# Patient Record
Sex: Female | Born: 1980 | Race: White | Hispanic: No | Marital: Married | Smoking: Never smoker
Health system: Southern US, Community
[De-identification: ages and names within clinical notes are randomized; demographics above are authoritative.]

## PROBLEM LIST (undated history)

## (undated) DIAGNOSIS — R011 Cardiac murmur, unspecified: Secondary | ICD-10-CM

## (undated) DIAGNOSIS — R5383 Other fatigue: Secondary | ICD-10-CM

## (undated) DIAGNOSIS — F419 Anxiety disorder, unspecified: Secondary | ICD-10-CM

## (undated) DIAGNOSIS — C801 Malignant (primary) neoplasm, unspecified: Secondary | ICD-10-CM

## (undated) DIAGNOSIS — R51 Headache: Secondary | ICD-10-CM

## (undated) DIAGNOSIS — R519 Headache, unspecified: Secondary | ICD-10-CM

## (undated) DIAGNOSIS — G901 Familial dysautonomia [Riley-Day]: Secondary | ICD-10-CM

## (undated) HISTORY — DX: Anxiety disorder, unspecified: F41.9

## (undated) HISTORY — DX: Familial dysautonomia (riley-day): G90.1

## (undated) HISTORY — DX: Headache, unspecified: R51.9

## (undated) HISTORY — DX: Malignant (primary) neoplasm, unspecified: C80.1

## (undated) HISTORY — DX: Headache: R51

## (undated) HISTORY — DX: Other fatigue: R53.83

## (undated) HISTORY — DX: Cardiac murmur, unspecified: R01.1

## (undated) HISTORY — PX: WISDOM TOOTH EXTRACTION: SHX21

---

## 1999-07-21 DIAGNOSIS — C801 Malignant (primary) neoplasm, unspecified: Secondary | ICD-10-CM

## 1999-07-21 HISTORY — DX: Malignant (primary) neoplasm, unspecified: C80.1

## 2013-08-21 ENCOUNTER — Ambulatory Visit: Payer: Self-pay | Admitting: Family Medicine

## 2013-09-06 ENCOUNTER — Ambulatory Visit: Payer: Self-pay | Admitting: Family Medicine

## 2013-10-23 ENCOUNTER — Ambulatory Visit: Payer: Self-pay | Admitting: Family Medicine

## 2013-10-26 ENCOUNTER — Encounter: Payer: Self-pay | Admitting: Family Medicine

## 2013-10-26 ENCOUNTER — Ambulatory Visit (INDEPENDENT_AMBULATORY_CARE_PROVIDER_SITE_OTHER): Payer: BC Managed Care – PPO | Admitting: Family Medicine

## 2013-10-26 VITALS — BP 112/64 | HR 71 | Temp 98.1°F | Ht 64.5 in | Wt 114.5 lb

## 2013-10-26 DIAGNOSIS — G901 Familial dysautonomia [Riley-Day]: Secondary | ICD-10-CM | POA: Insufficient documentation

## 2013-10-26 DIAGNOSIS — Z8669 Personal history of other diseases of the nervous system and sense organs: Secondary | ICD-10-CM | POA: Insufficient documentation

## 2013-10-26 DIAGNOSIS — G909 Disorder of the autonomic nervous system, unspecified: Secondary | ICD-10-CM

## 2013-10-26 DIAGNOSIS — F418 Other specified anxiety disorders: Secondary | ICD-10-CM | POA: Insufficient documentation

## 2013-10-26 DIAGNOSIS — G43909 Migraine, unspecified, not intractable, without status migrainosus: Secondary | ICD-10-CM | POA: Insufficient documentation

## 2013-10-26 NOTE — Progress Notes (Signed)
   Subjective:   Patient ID: Holly Wolf, female    DOB: 11-09-1980, 33 y.o.   MRN: [id number]  Holly Wolf is a pleasant 32 y.o. year old female who presents to clinic today with [place]  on 01/20/1637  HPI: Very complicated medical history.  Diagnosed with dysautonomia several years ago in grad school.  Multiple syncopal episodes and variations in her BP.  Per pt, most antihypertensives worsen her dysautonomia.  She does take atenolol for migraine prophylaxis. Migraines much improved since she left her job at [employer] and grad students.  Now a Production assistant, radio and job much less stressful.  Migraines- was followed by Dr. Governor Specking at [facility] for migraine headaches.  Could not tolerate many migraine prophylactic treatments.  Takes imitrex, toradol, fiorcet, flexeril and cambia for abortive therapy and atenolol and lexapro for prophylaxis.  She denies any anxiety or depression but takes seroquel as well.  She reports this is all for her migraines and dysautonomia however old records indicate "history of psychiatric hospitalization."  Also takes benzos when she feels her dysautonomia makes it impossible for her to relax.   Pap smear UTD until 2016.  Still seeing Dr. Greig Right at [facility]- she brings in those records with her today.  Patient Active Problem List   Diagnosis Date Noted  . History of migraine headaches 10/26/2013  . Dysautonomia 10/26/2013   Past Medical History  Diagnosis Date  . 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 Frequent headaches   . Heart murmur   . Dysautonomia    Past Surgical History  Procedure Laterality Date  . Wisdom tooth extraction  1999-2000   History  Substance Use Topics  . Smoking status: Never Smoker   . Smokeless tobacco: Never Used  . Alcohol Use: Yes   Family History  Problem Relation Age of Onset  . Hypertension Mother   . COPD Mother   . Cancer Mother   . Stroke Mother   . Hyperlipidemia Father   . Hypertension Sister     . Hyperlipidemia Sister   . Arthritis Maternal Grandmother   . Heart disease Maternal Grandmother   . Heart disease Maternal Grandfather    No Known Allergies No current outpatient prescriptions on file prior to visit.   No current facility-administered medications on file prior to visit.   The PMH, PSH, Social History, Family History, Medications, and allergies have been reviewed in [facility], and have been updated if relevant.     Review of Systems    See HPI  Objective:    BP 112/64  Pulse 71  Temp(Src) 98.1 F (36.7 C) (Oral)  Ht 5' 4.5" (1.638 m)  Wt 114 lb 8 oz (51.937 kg)  BMI 19.36 kg/m2  SpO2 99%   Physical Exam Gen:  Alert, pleasant, NAD Psych:  Good eye contact, conversant and pleasant       Assessment & Plan:   History of migraine headaches  Depression with anxiety  Dysautonomia Return in about 2 weeks (around 11/09/2013).

## 2013-10-26 NOTE — Assessment & Plan Note (Signed)
>  25 minutes spent in face to face time with patient, >50% spent in counselling or coordination of care Pt not examined during this office visit and we discussed her extensive medical history and reviewed her old records.  She will make an appt for a CPX in 1-2 weeks.

## 2013-10-26 NOTE — Progress Notes (Signed)
Pre visit review using our clinic review tool, if applicable. No additional management support is needed unless otherwise documented below in the visit note. 

## 2013-10-26 NOTE — Patient Instructions (Signed)
Nice to meet you. Please make an appointment at 7:15 am (for complete physical)- in 1-2 weeks.

## 2013-11-14 ENCOUNTER — Encounter: Payer: Self-pay | Admitting: *Deleted

## 2013-11-14 ENCOUNTER — Encounter: Payer: Self-pay | Admitting: Family Medicine

## 2013-11-14 ENCOUNTER — Ambulatory Visit (INDEPENDENT_AMBULATORY_CARE_PROVIDER_SITE_OTHER): Payer: BC Managed Care – PPO | Admitting: Family Medicine

## 2013-11-14 VITALS — BP 116/74 | HR 75 | Temp 97.6°F | Ht 65.0 in | Wt 115.2 lb

## 2013-11-14 DIAGNOSIS — I951 Orthostatic hypotension: Principal | ICD-10-CM

## 2013-11-14 DIAGNOSIS — G90A Postural orthostatic tachycardia syndrome (POTS): Secondary | ICD-10-CM | POA: Insufficient documentation

## 2013-11-14 DIAGNOSIS — I498 Other specified cardiac arrhythmias: Secondary | ICD-10-CM

## 2013-11-14 DIAGNOSIS — Z0000 Encounter for general adult medical examination without abnormal findings: Secondary | ICD-10-CM

## 2013-11-14 DIAGNOSIS — Z136 Encounter for screening for cardiovascular disorders: Secondary | ICD-10-CM

## 2013-11-14 DIAGNOSIS — Z8669 Personal history of other diseases of the nervous system and sense organs: Secondary | ICD-10-CM

## 2013-11-14 DIAGNOSIS — B0229 Other postherpetic nervous system involvement: Secondary | ICD-10-CM | POA: Insufficient documentation

## 2013-11-14 DIAGNOSIS — G909 Disorder of the autonomic nervous system, unspecified: Secondary | ICD-10-CM

## 2013-11-14 DIAGNOSIS — R000 Tachycardia, unspecified: Principal | ICD-10-CM

## 2013-11-14 DIAGNOSIS — G901 Familial dysautonomia [Riley-Day]: Secondary | ICD-10-CM

## 2013-11-14 LAB — CBC WITH DIFFERENTIAL/PLATELET
BASOS ABS: 0 10*3/uL (ref 0.0–0.1)
Basophils Relative: 0.6 % (ref 0.0–3.0)
Eosinophils Absolute: 0.2 10*3/uL (ref 0.0–0.7)
Eosinophils Relative: 3.2 % (ref 0.0–5.0)
HCT: 40.6 % (ref 36.0–46.0)
HEMOGLOBIN: 13.7 g/dL (ref 12.0–15.0)
LYMPHS PCT: 27 % (ref 12.0–46.0)
Lymphs Abs: 1.3 10*3/uL (ref 0.7–4.0)
MCHC: 33.8 g/dL (ref 30.0–36.0)
MCV: 89.9 fl (ref 78.0–100.0)
MONOS PCT: 9 % (ref 3.0–12.0)
Monocytes Absolute: 0.4 10*3/uL (ref 0.1–1.0)
NEUTROS PCT: 60.2 % (ref 43.0–77.0)
Neutro Abs: 3 10*3/uL (ref 1.4–7.7)
Platelets: 225 10*3/uL (ref 150.0–400.0)
RBC: 4.51 Mil/uL (ref 3.87–5.11)
RDW: 13.4 % (ref 11.5–14.6)
WBC: 5 10*3/uL (ref 4.5–10.5)

## 2013-11-14 LAB — COMPREHENSIVE METABOLIC PANEL
ALBUMIN: 4.2 g/dL (ref 3.5–5.2)
ALT: 11 U/L (ref 0–35)
AST: 13 U/L (ref 0–37)
Alkaline Phosphatase: 28 U/L — ABNORMAL LOW (ref 39–117)
BUN: 9 mg/dL (ref 6–23)
CALCIUM: 8.9 mg/dL (ref 8.4–10.5)
CHLORIDE: 105 meq/L (ref 96–112)
CO2: 25 mEq/L (ref 19–32)
Creatinine, Ser: 0.6 mg/dL (ref 0.4–1.2)
GFR: 112.89 mL/min (ref >60.00)
Glucose, Bld: 88 mg/dL (ref 70–99)
POTASSIUM: 4.2 meq/L (ref 3.5–5.1)
Sodium: 137 mEq/L (ref 135–145)
Total Bilirubin: 0.3 mg/dL (ref 0.3–1.2)
Total Protein: 6.9 g/dL (ref 6.0–8.3)

## 2013-11-14 LAB — LIPID PANEL
CHOL/HDL RATIO: 2
Cholesterol: 126 mg/dL (ref 0–200)
HDL: 58.4 mg/dL (ref >39.00)
LDL Cholesterol: 57 mg/dL (ref 0–99)
Triglycerides: 55 mg/dL (ref 0.0–149.0)
VLDL: 11 mg/dL (ref 0.0–40.0)

## 2013-11-14 LAB — TSH: TSH: 0.78 u[IU]/mL (ref 0.35–5.50)

## 2013-11-14 NOTE — Assessment & Plan Note (Signed)
Reviewed preventive care protocols, scheduled due services, and updated immunizations Discussed nutrition, exercise, diet, and healthy lifestyle. Orders Placed This Encounter  Procedures  . CBC with Differential  . Comprehensive metabolic panel  . Lipid panel  . TSH  . Ambulatory referral to Neurology    Referral Priority:  Routine    Referral Type:  Consultation    Referral Reason:  Specialty Services Required    Requested Specialty:  Neurology    Number of Visits Requested:  1  . Ambulatory referral to Cardiology    Referral Priority:  Routine    Referral Type:  Consultation    Referral Reason:  Specialty Services Required    Requested Specialty:  Cardiology    Number of Visits Requested:  1

## 2013-11-14 NOTE — Assessment & Plan Note (Signed)
Deteriorated but feels she is managing them ok.  Does not want to try another medication given side effects.

## 2013-11-14 NOTE — Progress Notes (Signed)
Pre visit review using our clinic review tool, if applicable. No additional management support is needed unless otherwise documented below in the visit note. 

## 2013-11-14 NOTE — Progress Notes (Signed)
Subjective:   Patient ID: Holly Wolf, female    DOB: 02-Jul-1981, 33 y.o.   MRN: [id number]  Holly Wolf is a pleasant 33 y.o. year old female who presents to clinic today for CPX/ Follow-up and Headache  on 1/76/1607  HPI: Very complicated medical history.  Established care with me two weeks ago and I asked to give me a couple of weeks to get her old records and review them.    Diagnosed with dysautonomia several years ago in grad school.  Multiple syncopal episodes and variations in her BP.  Per pt, most antihypertensives worsen her dysautonomia.  She does take atenolol for migraine prophylaxis. Last syncopal episode two weeks ago but this past week, she had to lay down a lot to avoid syncope.  Migraines much improved since she left her job at [employer] and grad students.  Now a Production assistant, radio and job much less stressful.  Has seen Dr.Klein at [facility] who has since retired (dysautonomia specialist)  Migraines- was followed by Dr. Governor Specking at [facility] for migraine headaches.  Could not tolerate many migraine prophylactic treatments.  Takes imitrex, toradol, fiorcet, flexeril and cambia for abortive therapy and atenolol and lexapro for prophylaxis.  She denies any anxiety or depression but takes seroquel as well. Also takes benzos when she feels her dysautonomia makes it impossible for her to relax.  Has a headache today.  Pap smear UTD until 09/2014.  Still seeing Dr. Greig Right at [facility]- she brings in those records with her today.  Has a dermatologist as well. Patient Active Problem List   Diagnosis Date Noted  . Routine general medical examination at a health care facility 11/14/2013  . Post herpetic neuralgia 11/14/2013  . POTS (postural orthostatic tachycardia syndrome) 11/14/2013  . History of migraine headaches 10/26/2013  . Dysautonomia 10/26/2013   Past Medical History  Diagnosis Date  . 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 Frequent headaches   . Heart murmur   .  Dysautonomia    Past Surgical History  Procedure Laterality Date  . Wisdom tooth extraction  1999-2000   History  Substance Use Topics  . Smoking status: Never Smoker   . Smokeless tobacco: Never Used  . Alcohol Use: Yes   Family History  Problem Relation Age of Onset  . Hypertension Mother   . COPD Mother   . Cancer Mother   . Stroke Mother   . Hyperlipidemia Father   . Hypertension Sister   . Hyperlipidemia Sister   . Arthritis Maternal Grandmother   . Heart disease Maternal Grandmother   . Heart disease Maternal Grandfather    No Known Allergies Current Outpatient Prescriptions on File Prior to Visit  Medication Sig Dispense Refill  . atenolol (TENORMIN) 25 MG tablet Take by mouth daily. Take 1/2 tablet every morning      . butalbital-acetaminophen-caffeine (FIORICET, ESGIC) 50-325-40 MG per tablet Take by mouth every 4 (four) hours as needed for headache. Take 1-2 capsules every 4 hours as needed      . clonazePAM (KLONOPIN) 1 MG tablet Take 1 mg by mouth every 6 (six) hours as needed for anxiety.      . cyclobenzaprine (FLEXERIL) 10 MG tablet Take 10 mg by mouth 3 (three) times daily as needed for muscle spasms. Take 1/2-1 tab daily as needed      . Diclofenac Potassium (CAMBIA) 50 MG PACK Take 50 mg by mouth as needed. Use as directed. Take no more than two  days per week      . escitalopram (LEXAPRO) 10 MG tablet Take 10 mg by mouth daily. Take 1/2 tab every morning      . ketorolac (TORADOL) 10 MG tablet Take 10 mg by mouth every 6 (six) hours as needed.      Marland Kitchen LORazepam (ATIVAN) 0.5 MG tablet Take 0.5 mg by mouth every 8 (eight) hours. Take 1-3 tabs three times daily as needed      . ondansetron (ZOFRAN) 4 MG tablet Take 4 mg by mouth every 8 (eight) hours as needed for nausea or vomiting.      . promethazine (PHENERGAN) 25 MG tablet Take 25 mg by mouth every 6 (six) hours as needed for nausea or vomiting.      Marland Kitchen QUEtiapine (SEROQUEL) 25 MG tablet Take 25 mg by mouth at  bedtime as needed.      . SUMAtriptan (IMITREX) 100 MG tablet Take 100 mg by mouth every 2 (two) hours as needed for migraine or headache. May repeat in 2 hours if headache persists or recurs.      . SUMAtriptan Succinate (SUMAVEL DOSEPRO) 6 MG/0.5ML SOTJ Inject 6 mg into the skin. Use as directed. Use no more than two days per week       No current facility-administered medications on file prior to visit.   The PMH, PSH, Social History, Family History, Medications, and allergies have been reviewed in [facility], and have been updated if relevant.     Review of Systems    See HPI + presyncope with vasalva Last syncopal episode two weeks ago Denies anxiety or depression No changes in bowel habits Objective:    BP 116/74  Pulse 75  Temp(Src) 97.6 F (36.4 C) (Oral)  Ht 5\' 5"  (1.651 m)  Wt 115 lb 4 oz (52.277 kg)  BMI 19.18 kg/m2  SpO2 97%   Physical Exam  General:  Well-developed,well-nourished,in no acute distress; alert,appropriate and cooperative throughout examination Head:  normocephalic and atraumatic.   Eyes:  vision grossly intact, pupils equal, pupils round, and pupils reactive to light.   Ears:  R ear normal and L ear normal.   Nose:  no external deformity.   Mouth:  good dentition.   Neck:  No deformities, masses, or tenderness noted. Lungs:  Normal respiratory effort, chest expands symmetrically. Lungs are clear to auscultation, no crackles or wheezes. Heart:  Normal rate and regular rhythm. S1 and S2 normal without gallop, murmur, click, rub or other extra sounds. Abdomen:  Bowel sounds positive,abdomen soft and non-tender without masses, organomegaly or hernias noted. Msk:  No deformity or scoliosis noted of thoracic or lumbar spine.   Extremities:  No clubbing, cyanosis, edema, or deformity noted with normal full range of motion of all joints.   Neurologic:  alert & oriented X3 and gait normal.   Skin:  Intact without suspicious lesions or rashes +tattoos Cervical  Nodes:  No lymphadenopathy noted Axillary Nodes:  No palpable lymphadenopathy Psych:  Cognition and judgment appear intact. Alert and cooperative with normal attention span and concentration. No apparent delusions, illusions, hallucinations        Assessment & Plan:   POTS (postural orthostatic tachycardia syndrome) - Plan: Ambulatory referral to Cardiology  Routine general medical examination at a health care facility - Plan: CBC with Differential, Comprehensive metabolic panel, TSH  Dysautonomia - Plan: Ambulatory referral to Neurology, Ambulatory referral to Cardiology  History of migraine headaches  Post herpetic neuralgia  Screening for ischemic heart disease -  Plan: Lipid panel No Follow-up on file.

## 2013-11-14 NOTE — Patient Instructions (Signed)
I will call you with your lab results. We will call you with your referrals.  It was great to see you!

## 2013-11-14 NOTE — Assessment & Plan Note (Signed)
With POTS. See below.

## 2013-11-14 NOTE — Assessment & Plan Note (Signed)
Will refer to [facility] dysautonomia specialist, Dr. Marlynn Perking and Dr. Caryl Comes (cards) to see if we could treat her with saline infusions. The patient indicates understanding of these issues and agrees with the plan.

## 2013-11-17 ENCOUNTER — Other Ambulatory Visit: Payer: Self-pay | Admitting: Family Medicine

## 2013-11-17 DIAGNOSIS — R000 Tachycardia, unspecified: Principal | ICD-10-CM

## 2013-11-17 DIAGNOSIS — G90A Postural orthostatic tachycardia syndrome (POTS): Secondary | ICD-10-CM

## 2013-11-17 DIAGNOSIS — I951 Orthostatic hypotension: Principal | ICD-10-CM

## 2013-11-28 ENCOUNTER — Ambulatory Visit: Payer: BC Managed Care – PPO | Admitting: Internal Medicine

## 2014-01-18 ENCOUNTER — Ambulatory Visit: Payer: BC Managed Care – PPO | Admitting: Family Medicine

## 2014-01-18 ENCOUNTER — Encounter: Payer: Self-pay | Admitting: Family Medicine

## 2014-01-18 ENCOUNTER — Ambulatory Visit (INDEPENDENT_AMBULATORY_CARE_PROVIDER_SITE_OTHER): Payer: BC Managed Care – PPO | Admitting: Family Medicine

## 2014-01-18 VITALS — BP 112/76 | HR 73 | Temp 98.6°F | Wt 114.2 lb

## 2014-01-18 DIAGNOSIS — B0229 Other postherpetic nervous system involvement: Secondary | ICD-10-CM

## 2014-01-18 MED ORDER — CYCLOBENZAPRINE HCL 10 MG PO TABS: 10.0000 mg | ORAL_TABLET | Freq: Three times a day (TID) | ORAL | Status: DC | PRN

## 2014-01-18 MED ORDER — CLONAZEPAM 1 MG PO TABS: 1.0000 mg | ORAL_TABLET | Freq: Four times a day (QID) | ORAL | Status: DC | PRN

## 2014-01-18 MED ORDER — LORAZEPAM 0.5 MG PO TABS: 0.5000 mg | ORAL_TABLET | Freq: Three times a day (TID) | ORAL | Status: DC

## 2014-01-18 NOTE — Patient Instructions (Signed)
Let me talk to Dr. Deborra Medina about valproate on the meantime.  Rosaria Ferries will call about your referral. Try the lidocaine patches in the meantime.  Take care.

## 2014-01-18 NOTE — Progress Notes (Signed)
Pre visit review using our clinic review tool, if applicable. No additional management support is needed unless otherwise documented below in the visit note.  H/o shingles with postherpetic neuralgia. More pain recently.  Clearly dermatomal on the L side of the abd, stops at the midline, no new rash.  Pain daily since 2012 in the area.  Can't sleep on L side.  Now painful even w/o anything touching the area.  She never had a nerve block  Lidocaine patch didn't help, last tried a few months ago.  Options complicated by migraines (opiates likely not a good option) and her h/o dysautonomia (TCA likely not tolerable, gabapentin and lyrica didn't help prev).  D/w pt about options.    Meds, vitals, and allergies reviewed.   ROS: See HPI.  Otherwise, noncontributory.  nad ncat Mmm rrr ctab Abd soft, not ttp except in dermatomal area on the L side of the abdomen, change in sensation to light touch No rash Ext w/o edema

## 2014-01-21 NOTE — Assessment & Plan Note (Signed)
Would avoid gabapentin, lyrica, opiates, and TCAs.  Current meds noted. The only thing I could find o/w with some evidence would be valproate.  Her LFTs were prev wnl.  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  I would like to talk to her PCP first about use of valproate.  It appears that she is going to need a nerve block or similar in the long run and I referred her to a pain clinic.  She agrees. She can retry the lidocaine patches in the meantime, though they weren't very effective prev.  >25 minutes spent in face to face time with patient, >50% spent in counselling or coordination of care.

## 2014-01-22 ENCOUNTER — Telehealth: Payer: Self-pay | Admitting: Family Medicine

## 2014-01-22 ENCOUNTER — Telehealth: Payer: Self-pay | Admitting: *Deleted

## 2014-01-22 NOTE — Telephone Encounter (Signed)
Spoke with Dr. Damita Dunnings- see office note from 7/2.  Left voicemail for pt to return to my call concerning tx options. ? Valproate.

## 2014-01-22 NOTE — Telephone Encounter (Signed)
Pt returned your call--i did advise pt you are in 1/2 day tomorrow so to keep a look out for your call

## 2014-01-22 NOTE — Telephone Encounter (Signed)
Received a copy of the script back from pharmacy requesting verification of directions on Flexeril. Please advise if it should be one three times a day as needed or .5 - 1 by mouth daily as needed. Please advise.

## 2014-01-23 MED ORDER — CYCLOBENZAPRINE HCL 10 MG PO TABS: ORAL_TABLET | ORAL | Status: DC

## 2014-01-23 MED ORDER — DIVALPROEX SODIUM ER 500 MG PO TB24: ORAL_TABLET | ORAL | Status: DC

## 2014-01-23 NOTE — Telephone Encounter (Signed)
Lori at pharmacy notified as instructed by telephone.

## 2014-01-23 NOTE — Addendum Note (Signed)
Addended by: Lucille Passy on: 01/23/2014 08:41 AM   Modules accepted: Orders

## 2014-01-23 NOTE — Telephone Encounter (Signed)
Spoke with pt.  She would like to try depakote.  Rx sent. She will follow up with me in 1 month and we will recheck liver function at that time.

## 2014-01-23 NOTE — Telephone Encounter (Signed)
Thanks.  Corrected sig: Take up to 1 tab tid prn. Please notify pharmacy.  Remainder of rx is correct.

## 2014-01-30 ENCOUNTER — Ambulatory Visit: Payer: BC Managed Care – PPO | Admitting: Internal Medicine

## 2014-02-08 ENCOUNTER — Encounter: Payer: Self-pay | Admitting: Internal Medicine

## 2014-02-08 ENCOUNTER — Ambulatory Visit (INDEPENDENT_AMBULATORY_CARE_PROVIDER_SITE_OTHER): Payer: BC Managed Care – PPO | Admitting: Internal Medicine

## 2014-02-08 VITALS — BP 134/104 | HR 75 | Ht 64.5 in | Wt 110.0 lb

## 2014-02-08 DIAGNOSIS — R000 Tachycardia, unspecified: Principal | ICD-10-CM

## 2014-02-08 DIAGNOSIS — I951 Orthostatic hypotension: Principal | ICD-10-CM

## 2014-02-08 DIAGNOSIS — G90A Postural orthostatic tachycardia syndrome (POTS): Secondary | ICD-10-CM

## 2014-02-08 DIAGNOSIS — I498 Other specified cardiac arrhythmias: Secondary | ICD-10-CM

## 2014-02-08 NOTE — Progress Notes (Signed)
ELECTROPHYSIOLOGY CONSULT NOTE  Patient ID: Holly Wolf, MRN: [id number], DOB/AGE: 1981-06-15 33 y.o. Admit date: (Not on file) Date of Consult: 02/08/2014  Primary Physician: Arnette Norris, MD Primary Cardiologist: enw  Chief Complaint:  ?POTS   HPI Holly Wolf is a 33 y.o. 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  She is also suffered from migraine headaches and the medications for this have made her " fogginess" of  Mind worse.  She finds some days it is difficult to think clearly. She has multiple episodes of lightheadedness which are largely abated by becoming recumbent. There is associated fatigue both elevated by the spells as well as generalized.  While she was doing her postop, elevated blood pressures were noted. She has not so much noted this since beginning teaching. Blood pressures have been always ben  modestly on the higher side.  Echo presumably normal   she also had a series of neurological complaints including numbness on the outer aspect of her arm. She notes that she has a sensation even when lying down of being on a waterbed at constant motion.  There is fair amount of heat intolerance     Past Medical History  Diagnosis Date  . 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 Frequent headaches   . Heart murmur   . Dysautonomia   . Anxiety   . Fatigue       Surgical History:  Past Surgical History  Procedure Laterality Date  . Wisdom tooth extraction  1999-2000     Home Meds: Prior to Admission medications   Medication Sig Start Date End Date Taking? Authorizing Provider  atenolol (TENORMIN) 25 MG tablet Take by  mouth daily. Take 1/2 tablet every morning   Yes Historical Provider, MD  butalbital-acetaminophen-caffeine (FIORICET, ESGIC) 50-325-40 MG per tablet Take by mouth every 4 (four) hours as needed for headache. Take 1-2 capsules every 4 hours as needed   Yes Historical Provider, MD  clonazePAM (KLONOPIN) 1 MG tablet Take 1 tablet (1 mg total) by mouth every 6 (six) hours as needed for anxiety. 01/18/14  Yes Tonia Ghent, MD  cyclobenzaprine (FLEXERIL) 10 MG tablet Take up to 1 tab tid prn. 01/23/14  Yes Tonia Ghent, MD  Diclofenac Potassium (CAMBIA) 50 MG PACK Take 50 mg by mouth as needed. Use as directed. Take no more than two days per week   Yes Historical Provider, MD  divalproex (DEPAKOTE ER) 500 MG 24 hr tablet 1 tab my mouth daily, may increase to 2 tabs by mouth daily after 7 days if well tolerated 01/23/14  Yes Lucille Passy, MD  escitalopram (LEXAPRO) 10 MG tablet Take 10 mg by mouth daily. Take 1/2 tab every morning   Yes Historical Provider, MD  ketorolac (TORADOL) 10 MG tablet Take 10 mg by mouth as needed (up to twice a month).    Yes Historical Provider, MD  LORazepam (ATIVAN) 0.5 MG tablet Take 1 tablet (0.5 mg total) by mouth every 8 (eight) hours. Take 1-3 tabs three times daily as needed 01/18/14  Yes Tonia Ghent, MD  ondansetron (ZOFRAN) 4 MG tablet Take 4 mg by  mouth every 8 (eight) hours as needed for nausea or vomiting.   Yes Historical Provider, MD  promethazine (PHENERGAN) 25 MG tablet Take 25 mg by mouth every 6 (six) hours as needed for nausea or vomiting.   Yes Historical Provider, MD  QUEtiapine (SEROQUEL) 25 MG tablet Take 25 mg by mouth at bedtime as needed.   Yes Historical Provider, MD  SUMAtriptan (IMITREX) 100 MG tablet Take 100 mg by mouth every 2 (two) hours as needed for migraine or headache. May repeat in 2 hours if headache persists or recurs.   Yes Historical Provider, MD  SUMAtriptan Succinate (SUMAVEL DOSEPRO) 6 MG/0.5ML SOTJ Inject 6 mg into the skin. Use as  directed. Use no more than two days per week   Yes Historical Provider, MD    Allergies: No Known Allergies  History   Social History  . Marital Status: Married    Spouse Name: N/A    Number of Children: N/A  . Years of Education: N/A   Occupational History  . Not on file.   Social History Main Topics  . Smoking status: Never Smoker   . Smokeless tobacco: Never Used  . Alcohol Use: Yes  . Drug Use: No  . Sexual Activity: Yes   Other Topics Concern  . Not on file   Social History Narrative  . No narrative on file     Family History  Problem Relation Age of Onset  . Hypertension Mother   . COPD Mother   . Cancer Mother   . Stroke Mother   . Hyperlipidemia Father   . Hypertension Sister   . Hyperlipidemia Sister   . Arthritis Maternal Grandmother   . Heart disease Maternal Grandmother   . Heart disease Maternal Grandfather      ROS:  Please see the history of present illness.     All other systems reviewed and negative.    Physical Exam:   Blood pressure 134/104, pulse 75, height 5' 4.5" (1.638 m), weight 110 lb (49.896 kg). Alert and oriented in no acute distress HENT- normal Eyes- EOMI, without scleral icterus Skin- warm and dry; without rashes LN-neg Neck- supple without thyromegaly, JVP-flat, carotids brisk and full without bruits Back-without CVAT or kyphosis Lungs-clear to auscultation CV-Regular rate and rhythm, nl S1 and S2, no murmurs gallops or rubs, S4-absent Abd-soft with active bowel sounds; no midline pulsation or hepatomegaly Pulses-intact femoral and distal MKS-without gross deformity Neuro- Ax O, CN3-12 intact, grossly normal motor and sensory function Affect engaging     Labs: Cardiac Enzymes No results found for this basename: CKTOTAL, CKMB, TROPONINI,  in the last 72 hours CBC Lab Results  Component Value Date   WBC 5.0 11/14/2013   HGB 13.7 11/14/2013   HCT 40.6 11/14/2013   MCV 89.9 11/14/2013   PLT 225.0 11/14/2013    PROTIME: No results found for this basename: LABPROT, INR,  in the last 72 hours Chemistry No results found for this basename: NA, K, CL, CO2, BUN, CREATININE, CALCIUM, LABALBU, PROT, BILITOT, ALKPHOS, ALT, AST, GLUCOSE,  in the last 168 hours Lipids Lab Results  Component Value Date   CHOL 126 11/14/2013   HDL 58.40 11/14/2013   LDLCALC 57 11/14/2013   TRIG 55.0 11/14/2013   BNP No results found for this basename: probnp   Miscellaneous No results found for this basename: DDIMER    Radiology/Studies:  No results found.  EKG: NSR 80 15/07/38 67   Assessment and Plan:  Dizziness  Systemic fatigue  Hypertension  The patient carries a diagnosis of POTS and orthostatic intolerance. Unfortunately, her objective data today are not consistent with those diagnoses not withstanding the presence of significant symptoms.   She is scheduled to see Dr. Gordy Savers and I will. I think that we'll be very valuable with her expertise. I've also suggested that there may be some value in having objective autonomic testing done which she may be able to do or if not perhaps Dr. Debria Garret can do at [facility].   Of incontinence in the past regarding neurological findings along her arms; she also has recovered symptoms which are inconsistent with vasomotor instability. I have told her that I am not sure how to explain her symptoms given the objective measurements noted today.   She also asked about intravenous fluids and I told her that the recent guidelines have been is class III recommendations and so we would not be able to help her with this   Her elevated blood pressure raises other questions regarding mechanisms, ie systemic hyperadrenergic release   She has some vasomotor instability as well manifested by blanching and violacsous discoloration of her hands and feets

## 2014-02-08 NOTE — Patient Instructions (Signed)
Keep your follow up with Fenton Malling at [facility].  Referral to see Dr. Debria Garret at [facility], after seeing [place].  Follow up with Dr. Caryl Comes as needed.

## 2014-04-13 ENCOUNTER — Encounter: Payer: Self-pay | Admitting: Family Medicine

## 2014-04-13 ENCOUNTER — Ambulatory Visit (INDEPENDENT_AMBULATORY_CARE_PROVIDER_SITE_OTHER): Payer: BC Managed Care – PPO | Admitting: Family Medicine

## 2014-04-13 VITALS — BP 92/68 | HR 63 | Temp 98.2°F | Wt 116.5 lb

## 2014-04-13 DIAGNOSIS — Z309 Encounter for contraceptive management, unspecified: Secondary | ICD-10-CM

## 2014-04-13 DIAGNOSIS — G90A Postural orthostatic tachycardia syndrome (POTS): Secondary | ICD-10-CM

## 2014-04-13 DIAGNOSIS — B0229 Other postherpetic nervous system involvement: Secondary | ICD-10-CM

## 2014-04-13 DIAGNOSIS — R000 Tachycardia, unspecified: Secondary | ICD-10-CM

## 2014-04-13 DIAGNOSIS — I951 Orthostatic hypotension: Secondary | ICD-10-CM

## 2014-04-13 DIAGNOSIS — G901 Familial dysautonomia [Riley-Day]: Secondary | ICD-10-CM

## 2014-04-13 DIAGNOSIS — I498 Other specified cardiac arrhythmias: Secondary | ICD-10-CM

## 2014-04-13 DIAGNOSIS — G909 Disorder of the autonomic nervous system, unspecified: Secondary | ICD-10-CM

## 2014-04-13 LAB — CBC WITH DIFFERENTIAL/PLATELET
BASOS PCT: 0 % (ref 0–1)
Basophils Absolute: 0 10*3/uL (ref 0.0–0.1)
EOS ABS: 0 10*3/uL (ref 0.0–0.7)
EOS PCT: 1 % (ref 0–5)
HCT: 37.5 % (ref 36.0–46.0)
Hemoglobin: 13.2 g/dL (ref 12.0–15.0)
LYMPHS ABS: 1.7 10*3/uL (ref 0.7–4.0)
Lymphocytes Relative: 36 % (ref 12–46)
MCH: 29.9 pg (ref 26.0–34.0)
MCHC: 35.2 g/dL (ref 30.0–36.0)
MCV: 85 fL (ref 78.0–100.0)
Monocytes Absolute: 0.4 10*3/uL (ref 0.1–1.0)
Monocytes Relative: 9 % (ref 3–12)
Neutro Abs: 2.6 10*3/uL (ref 1.7–7.7)
Neutrophils Relative %: 54 % (ref 43–77)
PLATELETS: 208 10*3/uL (ref 150–400)
RBC: 4.41 MIL/uL (ref 3.87–5.11)
RDW: 14.4 % (ref 11.5–15.5)
WBC: 4.8 10*3/uL (ref 4.0–10.5)

## 2014-04-13 LAB — COMPREHENSIVE METABOLIC PANEL
ALK PHOS: 30 U/L — AB (ref 39–117)
ALT: 13 U/L (ref 0–35)
AST: 17 U/L (ref 0–37)
Albumin: 4.6 g/dL (ref 3.5–5.2)
BILIRUBIN TOTAL: 0.6 mg/dL (ref 0.2–1.2)
BUN: 7 mg/dL (ref 6–23)
CO2: 26 mEq/L (ref 19–32)
CREATININE: 0.84 mg/dL (ref 0.50–1.10)
Calcium: 9.5 mg/dL (ref 8.4–10.5)
Chloride: 101 mEq/L (ref 96–112)
GLUCOSE: 90 mg/dL (ref 70–99)
Potassium: 3.8 mEq/L (ref 3.5–5.3)
Sodium: 138 mEq/L (ref 135–145)
Total Protein: 7.1 g/dL (ref 6.0–8.3)

## 2014-04-13 MED ORDER — DIVALPROEX SODIUM ER 500 MG PO TB24: ORAL_TABLET | ORAL | Status: DC

## 2014-04-13 NOTE — Progress Notes (Signed)
Subjective:   Patient ID: Holly Wolf, female    DOB: Jul 05, 1981, 33 y.o.   MRN: [id number]  Holly Wolf is a pleasant 33 y.o. year old female who presents to clinic today for CPX/ Follow-up  on 5/95/6387  HPI: Very complicated medical history.  Here for follow up.  Post herpetic neuralgia- now followed by Dr. Vira Blanco at pain clinic. He tried to prescribe her a topical therapy with 10% ketamine.  She has not picked it up yet since insurance is giving her a tough time.  She is taking the Depakote which she feels is helping.    Needs referral to GYN since we discussed option of ablation to help with her hypotension.  Still needing infusions for her POTS. Patient Active Problem List   Diagnosis Date Noted  . Routine general medical examination at a health care facility 11/14/2013  . Post herpetic neuralgia 11/14/2013  . POTS (postural orthostatic tachycardia syndrome) 11/14/2013  . History of migraine headaches 10/26/2013  . Dysautonomia 10/26/2013   Past Medical History  Diagnosis Date  . 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 Frequent headaches   . Heart murmur   . Dysautonomia   . Anxiety   . Fatigue    Past Surgical History  Procedure Laterality Date  . Wisdom tooth extraction  1999-2000   History  Substance Use Topics  . Smoking status: Never Smoker   . Smokeless tobacco: Never Used  . Alcohol Use: Yes   Family History  Problem Relation Age of Onset  . Hypertension Mother   . COPD Mother   . Cancer Mother   . Stroke Mother   . Hyperlipidemia Father   . Hypertension Sister   . Hyperlipidemia Sister   . Arthritis Maternal Grandmother   . Heart disease Maternal Grandmother   . Heart disease Maternal Grandfather    No Known Allergies Current Outpatient Prescriptions on File Prior to Visit  Medication Sig Dispense Refill  . atenolol (TENORMIN) 25 MG tablet Take by mouth daily. Take 1/2 tablet every morning      . butalbital-acetaminophen-caffeine  (FIORICET, ESGIC) 50-325-40 MG per tablet Take by mouth every 4 (four) hours as needed for headache. Take 1-2 capsules every 4 hours as needed      . clonazePAM (KLONOPIN) 1 MG tablet Take 1 tablet (1 mg total) by mouth every 6 (six) hours as needed for anxiety.  30 tablet  0  . cyclobenzaprine (FLEXERIL) 10 MG tablet Take up to 1 tab tid prn.      . Diclofenac Potassium (CAMBIA) 50 MG PACK Take 50 mg by mouth as needed. Use as directed. Take no more than two days per week      . escitalopram (LEXAPRO) 10 MG tablet Take 10 mg by mouth daily. Take 1/2 tab every morning      . ketorolac (TORADOL) 10 MG tablet Take 10 mg by mouth as needed (up to twice a month).       . LORazepam (ATIVAN) 0.5 MG tablet Take 1 tablet (0.5 mg total) by mouth every 8 (eight) hours. Take 1-3 tabs three times daily as needed  30 tablet  0  . ondansetron (ZOFRAN) 4 MG tablet Take 4 mg by mouth every 8 (eight) hours as needed for nausea or vomiting.      . promethazine (PHENERGAN) 25 MG tablet Take 25 mg by mouth every 6 (six) hours as needed for nausea or vomiting.      Marland Kitchen QUEtiapine (  SEROQUEL) 25 MG tablet Take 25 mg by mouth at bedtime as needed.      . SUMAtriptan (IMITREX) 100 MG tablet Take 100 mg by mouth every 2 (two) hours as needed for migraine or headache. May repeat in 2 hours if headache persists or recurs.      . SUMAtriptan Succinate (SUMAVEL DOSEPRO) 6 MG/0.5ML SOTJ Inject 6 mg into the skin. Use as directed. Use no more than two days per week       No current facility-administered medications on file prior to visit.   The PMH, PSH, Social History, Family History, Medications, and allergies have been reviewed in [facility], and have been updated if relevant.     Review of Systems    See HPI + presyncope with vasalva Last syncopal episode two weeks ago Denies anxiety or depression No changes in bowel habits Objective:    BP 92/68  Pulse 63  Temp(Src) 98.2 F (36.8 C) (Oral)  Wt 116 lb 8 oz (52.844 kg)   SpO2 99%   Physical Exam  General:  Well-developed,well-nourished,in no acute distress; alert,appropriate and cooperative throughout examination Head:  normocephalic and atraumatic.   Eyes:  vision grossly intact, pupils equal, pupils round, and pupils reactive to light.   Ears:  R ear normal and L ear normal.   Nose:  no external deformity.   Mouth:  good dentition.   Neck:  No deformities, masses, or tenderness noted. Lungs:  Normal respiratory effort, chest expands symmetrically. Lungs are clear to auscultation, no crackles or wheezes. Heart:  Normal rate and regular rhythm. S1 and S2 normal without gallop, murmur, click, rub or other extra sounds. Abdomen:  Bowel sounds positive,abdomen soft and non-tender without masses, organomegaly or hernias noted. Msk:  No deformity or scoliosis noted of thoracic or lumbar spine.   Extremities:  No clubbing, cyanosis, edema, or deformity noted with normal full range of motion of all joints.   Neurologic:  alert & oriented X3 and gait normal.   Skin:  Intact without suspicious lesions or rashes +tattoos Cervical Nodes:  No lymphadenopathy noted Axillary Nodes:  No palpable lymphadenopathy Psych:  Cognition and judgment appear intact. Alert and cooperative with normal attention span and concentration. No apparent delusions, illusions, hallucinations        Assessment & Plan:   Dysautonomia - Plan: CBC with Differential, Comprehensive metabolic panel  Post herpetic neuralgia  POTS (postural orthostatic tachycardia syndrome)  Unspecified contraceptive management - Plan: Ambulatory referral to Gynecology No Follow-up on file.

## 2014-04-13 NOTE — Assessment & Plan Note (Signed)
Refer to GYN- re: ? Ablation.

## 2014-04-13 NOTE — Patient Instructions (Signed)
It was great to see you. We will call you with your lab results and GYN referral.

## 2014-04-13 NOTE — Assessment & Plan Note (Signed)
Continue follow up with Dr. Vira Blanco. Hopefully she can pick up topical ketamine preparation. Depakote refilled- check labs today.

## 2014-04-13 NOTE — Progress Notes (Signed)
Pre visit review using our clinic review tool, if applicable. No additional management support is needed unless otherwise documented below in the visit note. 

## 2014-04-13 NOTE — Assessment & Plan Note (Signed)
Followed by two specialists. Feels she is having a bad month due to weather.

## 2014-04-16 ENCOUNTER — Encounter: Payer: Self-pay | Admitting: *Deleted

## 2014-04-26 ENCOUNTER — Encounter: Payer: Self-pay | Admitting: Family Medicine

## 2014-07-26 ENCOUNTER — Ambulatory Visit: Payer: BC Managed Care – PPO | Admitting: Family Medicine

## 2014-08-01 ENCOUNTER — Ambulatory Visit (INDEPENDENT_AMBULATORY_CARE_PROVIDER_SITE_OTHER): Payer: BC Managed Care – PPO | Admitting: Family Medicine

## 2014-08-01 ENCOUNTER — Encounter: Payer: Self-pay | Admitting: Family Medicine

## 2014-08-01 VITALS — BP 100/60 | HR 72 | Temp 98.0°F | Wt 116.0 lb

## 2014-08-01 DIAGNOSIS — G901 Familial dysautonomia [Riley-Day]: Secondary | ICD-10-CM

## 2014-08-01 DIAGNOSIS — G90A Postural orthostatic tachycardia syndrome (POTS): Secondary | ICD-10-CM

## 2014-08-01 DIAGNOSIS — Z8669 Personal history of other diseases of the nervous system and sense organs: Secondary | ICD-10-CM

## 2014-08-01 DIAGNOSIS — I951 Orthostatic hypotension: Secondary | ICD-10-CM

## 2014-08-01 DIAGNOSIS — G909 Disorder of the autonomic nervous system, unspecified: Secondary | ICD-10-CM

## 2014-08-01 DIAGNOSIS — R000 Tachycardia, unspecified: Secondary | ICD-10-CM

## 2014-08-01 MED ORDER — FROVATRIPTAN SUCCINATE 2.5 MG PO TABS: 2.5000 mg | ORAL_TABLET | ORAL | Status: DC | PRN

## 2014-08-01 MED ORDER — LORAZEPAM 0.5 MG PO TABS: 0.5000 mg | ORAL_TABLET | Freq: Three times a day (TID) | ORAL | Status: DC | PRN

## 2014-08-01 MED ORDER — BISOPROLOL FUMARATE 5 MG PO TABS: 2.5000 mg | ORAL_TABLET | Freq: Every day | ORAL | Status: DC

## 2014-08-01 MED ORDER — CYCLOBENZAPRINE HCL 10 MG PO TABS: 10.0000 mg | ORAL_TABLET | Freq: Three times a day (TID) | ORAL | Status: DC | PRN

## 2014-08-01 MED ORDER — CLONAZEPAM 1 MG PO TABS: 1.0000 mg | ORAL_TABLET | Freq: Four times a day (QID) | ORAL | Status: DC | PRN

## 2014-08-01 NOTE — Assessment & Plan Note (Signed)
Reasonable control on current rxs. No changes made today.

## 2014-08-01 NOTE — Progress Notes (Signed)
Subjective:   Patient ID: Holly Wolf, female    DOB: 1980-12-13, 34 y.o.   MRN: [id number]  Holly Wolf is a pleasant 34 y.o. year old female who presents to clinic today for CPX/ No chief complaint on file.  on 07/28/3233  HPI: Very complicated medical history.  Here for follow up.  Diagnosed again with POTS- went through tilt table tests again.  Followed by Dr. Anson Fret- [facility]- awaiting records. Started on bisoprolol, could not tolerate atenolol anymore and insurance would not pay for bystolic.  Feels ok now- has her good days and bad days. BP running low so she has to drink electrolyte solutions before taking her bisoprolol.  Frova and cambia and have more effective for her migraines.  New IUD placed.  Does take flexeril and ativan as needed for several issues but recently has been having more insomnia which occassionally will take one of them for this.  Patient Active Problem List   Diagnosis Date Noted  . Routine general medical examination at a health care facility 11/14/2013  . Post herpetic neuralgia 11/14/2013  . POTS (postural orthostatic tachycardia syndrome) 11/14/2013  . History of migraine headaches 10/26/2013  . Dysautonomia 10/26/2013   Past Medical History  Diagnosis Date  . 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 Frequent headaches   . Heart murmur   . Dysautonomia   . Anxiety   . Fatigue    Past Surgical History  Procedure Laterality Date  . Wisdom tooth extraction  1999-2000   History  Substance Use Topics  . Smoking status: Never Smoker   . Smokeless tobacco: Never Used  . Alcohol Use: Yes   Family History  Problem Relation Age of Onset  . Hypertension Mother   . COPD Mother   . Cancer Mother   . Stroke Mother   . Hyperlipidemia Father   . Hypertension Sister   . Hyperlipidemia Sister   . Arthritis Maternal Grandmother   . Heart disease Maternal Grandmother   . Heart disease Maternal Grandfather    No Known Allergies Current  Outpatient Prescriptions on File Prior to Visit  Medication Sig Dispense Refill  . butalbital-acetaminophen-caffeine (FIORICET, ESGIC) 50-325-40 MG per tablet Take by mouth every 4 (four) hours as needed for headache. Take 1-2 capsules every 4 hours as needed    . clonazePAM (KLONOPIN) 1 MG tablet Take 1 tablet (1 mg total) by mouth every 6 (six) hours as needed for anxiety. 30 tablet 0  . cyclobenzaprine (FLEXERIL) 10 MG tablet Take up to 1 tab tid prn.    . Diclofenac Potassium (CAMBIA) 50 MG PACK Take 50 mg by mouth as needed. Use as directed. Take no more than two days per week    . divalproex (DEPAKOTE ER) 500 MG 24 hr tablet 1 tab my mouth daily, may increase to 2 tabs by mouth daily after 7 days if well tolerated 60 tablet 3  . ketorolac (TORADOL) 10 MG tablet Take 10 mg by mouth as needed (up to twice a month).     . LORazepam (ATIVAN) 0.5 MG tablet Take 1 tablet (0.5 mg total) by mouth every 8 (eight) hours. Take 1-3 tabs three times daily as needed 30 tablet 0  . ondansetron (ZOFRAN) 4 MG tablet Take 4 mg by mouth every 8 (eight) hours as needed for nausea or vomiting.    . promethazine (PHENERGAN) 25 MG tablet Take 25 mg by mouth every 6 (six) hours as needed for nausea or vomiting.    Marland Kitchen  QUEtiapine (SEROQUEL) 25 MG tablet Take 25 mg by mouth at bedtime as needed.    . SUMAtriptan (IMITREX) 100 MG tablet Take 100 mg by mouth every 2 (two) hours as needed for migraine or headache. May repeat in 2 hours if headache persists or recurs.    . SUMAtriptan Succinate (SUMAVEL DOSEPRO) 6 MG/0.5ML SOTJ Inject 6 mg into the skin. Use as directed. Use no more than two days per week     No current facility-administered medications on file prior to visit.   The PMH, PSH, Social History, Family History, Medications, and allergies have been reviewed in [facility], and have been updated if relevant.     Review of Systems  Constitutional: Negative.   HENT: Negative.   Eyes: Negative.   Respiratory:  Negative.   Cardiovascular: Negative.   Gastrointestinal: Negative.   Endocrine: Negative.   Skin: Negative.   Neurological: Positive for light-headedness. Negative for seizures.  Psychiatric/Behavioral: Positive for sleep disturbance.  All other systems reviewed and are negative.     Objective:    BP 100/60 mmHg  Pulse 72  Temp(Src) 98 F (36.7 C) (Tympanic)  Wt 116 lb (52.617 kg)   Physical Exam  Constitutional: She is oriented to person, place, and time. She appears well-developed and well-nourished. No distress.  HENT:  Head: Normocephalic.  Eyes: Conjunctivae are normal.  Neck: Normal range of motion.  Cardiovascular: Normal rate.   Pulmonary/Chest: Effort normal.  Musculoskeletal: Normal range of motion.  Neurological: She is alert and oriented to person, place, and time. No cranial nerve deficit.  Skin: Skin is warm and dry.  Psychiatric: She has a normal mood and affect. Her behavior is normal. Judgment and thought content normal.            Assessment & Plan:   POTS (postural orthostatic tachycardia syndrome)  Dysautonomia No Follow-up on file.

## 2014-08-01 NOTE — Assessment & Plan Note (Signed)
Recently "rediagnosed" with this.  Will request records. No recent hospitalizations. >25 minutes spent in face to face time with patient, >50% spent in counselling or coordination of care Reviewed med list at length and updated.

## 2014-08-01 NOTE — Progress Notes (Signed)
Pre visit review using our clinic review tool, if applicable. No additional management support is needed unless otherwise documented below in the visit note. 

## 2014-09-21 ENCOUNTER — Telehealth: Payer: Self-pay

## 2014-09-21 NOTE — Telephone Encounter (Signed)
Noted! Thank you

## 2014-09-21 NOTE — Telephone Encounter (Signed)
Spoke to pt and advised her of insurance notification. Pt is currently taking sumatriptan succinate injections. Unfortunately, since it is currently working, they will not cover frova. Pt has Rx for imitrex, sumatriptan succinate and fioricet. They will not cover an additional medication until these are no longer effective. Pt states that she previously had a neurologist, but no longer sees them. Pt states that she is unable to take each medication more than twice a week, or it will cause her to have rebound HA. Advised pt that she may need an OV to discuss alternate Tx options and possible referral. Pt agrees but states that she will view schedule and contact office back to schedule.

## 2014-09-21 NOTE — Telephone Encounter (Signed)
Please call pt to discuss- complicated medical history and has likely tried several of these.

## 2014-09-21 NOTE — Telephone Encounter (Signed)
Patients insurance will not cover frova.  They request patient try and fail naratriptan, rizatriptan benzoate, sumatriptan succinate and or zolmitriptan.  Please advise if any of these are appropriate or do prior authorization.

## 2014-10-16 ENCOUNTER — Telehealth: Payer: Self-pay | Admitting: Family Medicine

## 2014-10-16 NOTE — Telephone Encounter (Signed)
Spoke to pt and informed her paperwork is available for pickup from the front desk

## 2014-10-16 NOTE — Telephone Encounter (Signed)
Pt's spouse dropped off handicap form to be completed by Dr. Deborra Medina.   Form is on [employer].

## 2015-02-21 ENCOUNTER — Encounter (INDEPENDENT_AMBULATORY_CARE_PROVIDER_SITE_OTHER): Payer: Self-pay

## 2015-02-21 ENCOUNTER — Ambulatory Visit (INDEPENDENT_AMBULATORY_CARE_PROVIDER_SITE_OTHER): Payer: BC Managed Care – PPO | Admitting: Family Medicine

## 2015-02-21 ENCOUNTER — Encounter: Payer: Self-pay | Admitting: Family Medicine

## 2015-02-21 VITALS — BP 102/60 | HR 52 | Temp 98.1°F | Ht 64.75 in | Wt 116.5 lb

## 2015-02-21 DIAGNOSIS — Z0000 Encounter for general adult medical examination without abnormal findings: Secondary | ICD-10-CM

## 2015-02-21 DIAGNOSIS — B0229 Other postherpetic nervous system involvement: Secondary | ICD-10-CM

## 2015-02-21 DIAGNOSIS — R000 Tachycardia, unspecified: Secondary | ICD-10-CM

## 2015-02-21 DIAGNOSIS — Z8669 Personal history of other diseases of the nervous system and sense organs: Secondary | ICD-10-CM

## 2015-02-21 DIAGNOSIS — I951 Orthostatic hypotension: Secondary | ICD-10-CM

## 2015-02-21 DIAGNOSIS — G909 Disorder of the autonomic nervous system, unspecified: Secondary | ICD-10-CM | POA: Diagnosis not present

## 2015-02-21 DIAGNOSIS — G90A Postural orthostatic tachycardia syndrome (POTS): Secondary | ICD-10-CM

## 2015-02-21 DIAGNOSIS — Z01419 Encounter for gynecological examination (general) (routine) without abnormal findings: Secondary | ICD-10-CM

## 2015-02-21 DIAGNOSIS — G901 Familial dysautonomia [Riley-Day]: Secondary | ICD-10-CM

## 2015-02-21 NOTE — Progress Notes (Signed)
Subjective:   Patient ID: Holly Wolf, female    DOB: [birth year], 34 y.o.   MRN: [id number]  Holly Wolf is a pleasant 34 y.o. year old female who presents to clinic today for CPX/ Annual Exam  and follow up of chronic medical conditions on 09/21/2874  HPI: Very complicated medical history.   Saw cardiologist and neurologist yesterday. No on bisoprolol which has helped to keep her blood pressure more stable.  Also on propranolol and midodrine.  Also now wearing a fit bit to track her heart rate. 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   Has had tingling in her left arm followed by neuro.  Neg EMG.  Has MRI scheduled. Per pt, plan is to d/c depakote and start another x. Thinking about botox for migraines.  Migraines improved.   Takes imitrex, toradol, fiorcet, flexeril and cambia for abortive therapy and atenolol and lexapro for prophylaxis.  She denies any anxiety or depression but takes seroquel as well. Also takes benzos when she feels her dysautonomia makes it impossible for her to relax.    Has GYN- Pap smear UTD until 04/2015.  Has an IUD.   Lab Results  Component Value Date   WBC 4.8 04/13/2014   HGB 13.2 04/13/2014   HCT 37.5 04/13/2014   MCV 85.0 04/13/2014   PLT 208 04/13/2014   Lab Results  Component Value Date   CHOL 126 11/14/2013   HDL 58.40 11/14/2013   LDLCALC 57 11/14/2013   TRIG 55.0 11/14/2013   CHOLHDL 2 11/14/2013   Lab Results  Component Value Date   CREATININE 0.84 04/13/2014   BUN 7 04/13/2014   NA 138 04/13/2014   K 3.8 04/13/2014   CL 101 04/13/2014   CO2 26 04/13/2014   Lab Results  Component Value Date   TSH 0.78 11/14/2013    Patient Active Problem List   Diagnosis Date Noted  . Well woman exam 02/21/2015  . Post herpetic neuralgia 11/14/2013  . POTS (postural orthostatic tachycardia syndrome) 11/14/2013  . History of migraine headaches 10/26/2013  . Dysautonomia 10/26/2013   Past Medical History  Diagnosis Date  .  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 Frequent headaches   . Heart murmur   . Dysautonomia   . Anxiety   . Fatigue    Past Surgical History  Procedure Laterality Date  . Wisdom tooth extraction  1999-2000   History  Substance Use Topics  . Smoking status: Never Smoker   . Smokeless tobacco: Never Used  . Alcohol Use: Yes   Family History  Problem Relation Age of Onset  . Hypertension Mother   . COPD Mother   . Cancer Mother   . Stroke Mother   . Hyperlipidemia Father   . Hypertension Sister   . Hyperlipidemia Sister   . Arthritis Maternal Grandmother   . Heart disease Maternal Grandmother   . Heart disease Maternal Grandfather    No Known Allergies Current Outpatient Prescriptions on File Prior to Visit  Medication Sig Dispense Refill  . bisoprolol (ZEBETA) 5 MG tablet Take 0.5 tablets (2.5 mg total) by mouth daily.    . butalbital-acetaminophen-caffeine (FIORICET WITH CODEINE) 50-325-40-30 MG per capsule Take 1 capsule by mouth every 4 (four) hours as needed for headache.    . clonazePAM (KLONOPIN) 1 MG tablet Take 1 tablet (1 mg total) by mouth every 6 (six) hours as needed for anxiety. 30 tablet 0  . cyclobenzaprine (FLEXERIL) 10 MG tablet Take  1 tablet (10 mg total) by mouth 3 (three) times daily as needed. 30 tablet 0  . Diclofenac Potassium 50 MG PACK Take by mouth as needed.    . divalproex (DEPAKOTE ER) 500 MG 24 hr tablet Take 500-1,000 mg by mouth daily.    . frovatriptan (FROVA) 2.5 MG tablet Take 1 tablet (2.5 mg total) by mouth as needed for migraine. If recurs, may repeat after 2 hours. Max of 3 tabs in 24 hours. 10 tablet 0  . ketorolac (TORADOL) 10 MG tablet Take 10 mg by mouth as needed (up to twice a month).     . LORazepam (ATIVAN) 0.5 MG tablet Take 1 tablet (0.5 mg total) by mouth every 8 (eight) hours as needed for anxiety. 30 tablet 0  . ondansetron (ZOFRAN) 4 MG tablet Take 4 mg by mouth every 8 (eight) hours as needed for nausea or vomiting.    .  promethazine (PHENERGAN) 25 MG tablet Take 25 mg by mouth every 6 (six) hours as needed for nausea or vomiting.    Marland Kitchen QUEtiapine (SEROQUEL) 25 MG tablet Take 25 mg by mouth at bedtime as needed.    . SUMAtriptan (IMITREX) 100 MG tablet Take 100 mg by mouth every 2 (two) hours as needed for migraine or headache. May repeat in 2 hours if headache persists or recurs.    . SUMAtriptan Succinate (SUMAVEL DOSEPRO) 6 MG/0.5ML SOTJ Inject 6 mg into the skin. Use as directed. Use no more than two days per week     No current facility-administered medications on file prior to visit.   The PMH, PSH, Social History, Family History, Medications, and allergies have been reviewed in [facility], and have been updated if relevant.     Review of Systems  Constitutional: Negative.   HENT: Negative.   Eyes: Negative.   Respiratory: Negative.   Cardiovascular: Negative.   Gastrointestinal: Negative.   Endocrine: Negative.   Genitourinary: Negative.   Musculoskeletal: Negative.   Skin: Negative.   Allergic/Immunologic: Negative.   Neurological: Positive for light-headedness.  Hematological: Negative.   Psychiatric/Behavioral: Negative.        Objective:    BP 102/60 mmHg  Pulse 52  Temp(Src) 98.1 F (36.7 C) (Oral)  Ht 5' 4.75" (1.645 m)  Wt 116 lb 8 oz (52.844 kg)  BMI 19.53 kg/m2  SpO2 99%   Physical Exam  General:  Well-developed,well-nourished,in no acute distress; alert,appropriate and cooperative throughout examination Head:  normocephalic and atraumatic.   Eyes:  vision grossly intact, pupils equal, pupils round, and pupils reactive to light.   Ears:  R ear normal and L ear normal.   Nose:  no external deformity.   Mouth:  good dentition.   Neck:  No deformities, masses, or tenderness noted. Lungs:  Normal respiratory effort, chest expands symmetrically. Lungs are clear to auscultation, no crackles or wheezes. Heart:  Normal rate and regular rhythm. S1 and S2 normal without gallop,  murmur, click, rub or other extra sounds. Abdomen:  Bowel sounds positive,abdomen soft and non-tender without masses, organomegaly or hernias noted. Msk:  No deformity or scoliosis noted of thoracic or lumbar spine.   Extremities:  No clubbing, cyanosis, edema, or deformity noted with normal full range of motion of all joints.   Neurologic:  alert & oriented X3 and gait normal.   Skin:  Intact without suspicious lesions or rashes +tattoos Cervical Nodes:  No lymphadenopathy noted Axillary Nodes:  No palpable lymphadenopathy Psych:  Cognition and judgment appear intact. Alert  and cooperative with normal attention span and concentration. No apparent delusions, illusions, hallucinations        Assessment & Plan:   Well woman exam  POTS (postural orthostatic tachycardia syndrome)  Post herpetic neuralgia  Dysautonomia  History of migraine headaches No Follow-up on file.

## 2015-02-21 NOTE — Assessment & Plan Note (Signed)
Reviewed preventive care protocols, scheduled due services, and updated immunizations Discussed nutrition, exercise, diet, and healthy lifestyle.  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  Needs to return tomorrow for TB skin test.

## 2015-02-21 NOTE — Assessment & Plan Note (Signed)
Migraines improved.

## 2015-02-21 NOTE — Assessment & Plan Note (Signed)
No recent syncopal episodes. Saw neuro and cardio at [facility] yesterday- notes reviewed in [place].

## 2015-02-21 NOTE — Progress Notes (Signed)
Pre visit review using our clinic review tool, if applicable. No additional management support is needed unless otherwise documented below in the visit note. 

## 2015-02-21 NOTE — Patient Instructions (Signed)
Great to see you. Please come back tomorrow to have your TB skin test done.

## 2015-02-22 ENCOUNTER — Ambulatory Visit (INDEPENDENT_AMBULATORY_CARE_PROVIDER_SITE_OTHER): Payer: BC Managed Care – PPO

## 2015-02-22 DIAGNOSIS — Z111 Encounter for screening for respiratory tuberculosis: Secondary | ICD-10-CM | POA: Diagnosis not present

## 2015-02-25 LAB — TB SKIN TEST: TB Skin Test: NEGATIVE

## 2015-07-03 ENCOUNTER — Encounter: Payer: Self-pay | Admitting: Family Medicine

## 2015-07-03 ENCOUNTER — Ambulatory Visit (INDEPENDENT_AMBULATORY_CARE_PROVIDER_SITE_OTHER): Payer: BC Managed Care – PPO | Admitting: Family Medicine

## 2015-07-03 VITALS — BP 104/60 | HR 52 | Temp 97.9°F | Wt 115.2 lb

## 2015-07-03 DIAGNOSIS — R000 Tachycardia, unspecified: Secondary | ICD-10-CM

## 2015-07-03 DIAGNOSIS — B0229 Other postherpetic nervous system involvement: Secondary | ICD-10-CM | POA: Diagnosis not present

## 2015-07-03 DIAGNOSIS — Z23 Encounter for immunization: Secondary | ICD-10-CM

## 2015-07-03 DIAGNOSIS — G909 Disorder of the autonomic nervous system, unspecified: Secondary | ICD-10-CM

## 2015-07-03 DIAGNOSIS — G901 Familial dysautonomia [Riley-Day]: Secondary | ICD-10-CM

## 2015-07-03 DIAGNOSIS — T300 Burn of unspecified body region, unspecified degree: Secondary | ICD-10-CM

## 2015-07-03 DIAGNOSIS — G90A Postural orthostatic tachycardia syndrome (POTS): Secondary | ICD-10-CM

## 2015-07-03 DIAGNOSIS — I951 Orthostatic hypotension: Secondary | ICD-10-CM

## 2015-07-03 DIAGNOSIS — Z8669 Personal history of other diseases of the nervous system and sense organs: Secondary | ICD-10-CM

## 2015-07-03 MED ORDER — CYCLOBENZAPRINE HCL 10 MG PO TABS: 10.0000 mg | ORAL_TABLET | Freq: Three times a day (TID) | ORAL | Status: DC | PRN

## 2015-07-03 MED ORDER — CLONAZEPAM 1 MG PO TABS: 1.0000 mg | ORAL_TABLET | Freq: Four times a day (QID) | ORAL | Status: DC | PRN

## 2015-07-03 MED ORDER — LORAZEPAM 0.5 MG PO TABS: 0.5000 mg | ORAL_TABLET | Freq: Three times a day (TID) | ORAL | Status: DC | PRN

## 2015-07-03 MED ORDER — HYDROCODONE-ACETAMINOPHEN 5-325 MG PO TABS: 1.0000 | ORAL_TABLET | Freq: Four times a day (QID) | ORAL | Status: DC | PRN

## 2015-07-03 NOTE — Assessment & Plan Note (Signed)
No recent syncopal episodes.
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
## 2015-07-03 NOTE — Assessment & Plan Note (Signed)
Followed by multiple specialists.

## 2015-07-03 NOTE — Assessment & Plan Note (Addendum)
New- with persistent neuropathic pain. >25 minutes spent in face to face time with patient, >50% spent in counselling or coordination of care  Has not tolerated gabapentin or lyrica in past.  She has tried elavil. Given rx for short course of prn vicodin at night for SEVERE pain. Continue to follow with neurology. The patient indicates understanding of these issues and agrees with the plan.

## 2015-07-03 NOTE — Progress Notes (Signed)
Pre visit review using our clinic review tool, if applicable. No additional management support is needed unless otherwise documented below in the visit note. 

## 2015-08-01 ENCOUNTER — Ambulatory Visit (INDEPENDENT_AMBULATORY_CARE_PROVIDER_SITE_OTHER): Payer: BC Managed Care – PPO | Admitting: Family Medicine

## 2015-08-01 ENCOUNTER — Encounter: Payer: Self-pay | Admitting: Family Medicine

## 2015-08-01 VITALS — BP 122/84 | HR 60 | Temp 98.5°F | Wt 117.5 lb

## 2015-08-01 DIAGNOSIS — R000 Tachycardia, unspecified: Secondary | ICD-10-CM

## 2015-08-01 DIAGNOSIS — G90A Postural orthostatic tachycardia syndrome (POTS): Secondary | ICD-10-CM

## 2015-08-01 DIAGNOSIS — I951 Orthostatic hypotension: Secondary | ICD-10-CM

## 2015-08-01 DIAGNOSIS — G909 Disorder of the autonomic nervous system, unspecified: Secondary | ICD-10-CM | POA: Diagnosis not present

## 2015-08-01 DIAGNOSIS — G901 Familial dysautonomia [Riley-Day]: Secondary | ICD-10-CM

## 2015-08-01 DIAGNOSIS — T300 Burn of unspecified body region, unspecified degree: Secondary | ICD-10-CM
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
## 2015-08-01 NOTE — Progress Notes (Signed)
Pre visit review using our clinic review tool, if applicable. No additional management support is needed unless otherwise documented below in the visit note. 
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
## 2015-08-29 ENCOUNTER — Encounter: Payer: Self-pay | Admitting: Family Medicine

## 2015-08-29 ENCOUNTER — Ambulatory Visit (INDEPENDENT_AMBULATORY_CARE_PROVIDER_SITE_OTHER): Payer: BC Managed Care – PPO | Admitting: Family Medicine

## 2015-08-29 VITALS — BP 108/60 | HR 73 | Temp 98.5°F | Wt 111.0 lb

## 2015-08-29 DIAGNOSIS — R634 Abnormal weight loss: Secondary | ICD-10-CM | POA: Diagnosis not present

## 2015-08-29 DIAGNOSIS — I951 Orthostatic hypotension: Secondary | ICD-10-CM

## 2015-08-29 DIAGNOSIS — B0229 Other postherpetic nervous system involvement: Secondary | ICD-10-CM

## 2015-08-29 DIAGNOSIS — T300 Burn of unspecified body region, unspecified degree: Secondary | ICD-10-CM | POA: Diagnosis not present

## 2015-08-29 DIAGNOSIS — R000 Tachycardia, unspecified: Secondary | ICD-10-CM

## 2015-08-29 DIAGNOSIS — G90A Postural orthostatic tachycardia syndrome (POTS): Secondary | ICD-10-CM

## 2015-08-29 MED ORDER — HYDROCODONE-ACETAMINOPHEN 5-325 MG PO TABS: 1.0000 | ORAL_TABLET | Freq: Four times a day (QID) | ORAL | Status: DC | PRN

## 2015-08-29 NOTE — Progress Notes (Signed)
Pre visit review using our clinic review tool, if applicable. No additional management support is needed unless otherwise documented below in the visit note. 
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
## 2015-08-29 NOTE — Assessment & Plan Note (Signed)
>  25 minutes spent in face to face time with patient, >50% spent in counselling or coordination of care Discussing her pain management and weight loss.  Agreed to prescribe narcotics to pt if she follows pain contract since pain management will not see her or even consider a ganglion block, per pt.  Rx printed and give to pt today.

## 2015-08-29 NOTE — Assessment & Plan Note (Signed)
Advised adding ensure three times daily and follow up with me in 1 month. The patient indicates understanding of these issues and agrees with the plan.

## 2015-10-16 ENCOUNTER — Ambulatory Visit (INDEPENDENT_AMBULATORY_CARE_PROVIDER_SITE_OTHER): Payer: BC Managed Care – PPO | Admitting: Family Medicine

## 2015-10-16 ENCOUNTER — Encounter: Payer: Self-pay | Admitting: Family Medicine

## 2015-10-16 VITALS — BP 114/66 | HR 60 | Temp 97.7°F | Wt 110.5 lb

## 2015-10-16 DIAGNOSIS — R000 Tachycardia, unspecified: Secondary | ICD-10-CM | POA: Diagnosis not present

## 2015-10-16 DIAGNOSIS — T300 Burn of unspecified body region, unspecified degree: Secondary | ICD-10-CM | POA: Diagnosis not present

## 2015-10-16 DIAGNOSIS — I951 Orthostatic hypotension: Secondary | ICD-10-CM | POA: Diagnosis not present

## 2015-10-16 DIAGNOSIS — G90A Postural orthostatic tachycardia syndrome (POTS): Secondary | ICD-10-CM

## 2015-10-16 DIAGNOSIS — G909 Disorder of the autonomic nervous system, unspecified: Secondary | ICD-10-CM

## 2015-10-16 DIAGNOSIS — G901 Familial dysautonomia [Riley-Day]: Secondary | ICD-10-CM

## 2015-10-16 MED ORDER — CYCLOBENZAPRINE HCL 10 MG PO TABS: 10.0000 mg | ORAL_TABLET | Freq: Three times a day (TID) | ORAL | Status: DC | PRN

## 2015-10-16 MED ORDER — CLONAZEPAM 1 MG PO TABS: 1.0000 mg | ORAL_TABLET | Freq: Four times a day (QID) | ORAL | Status: DC | PRN

## 2015-10-16 MED ORDER — HYDROCODONE-ACETAMINOPHEN 5-325 MG PO TABS: 1.0000 | ORAL_TABLET | Freq: Four times a day (QID) | ORAL | Status: DC | PRN
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
## 2015-10-16 NOTE — Progress Notes (Signed)
Pre visit review using our clinic review tool, if applicable. No additional management support is needed unless otherwise documented below in the visit note. 

## 2015-10-16 NOTE — Assessment & Plan Note (Signed)
>  25 minutes spent in face to face time with patient, >50% spent in counselling or coordination of care concerning POTs, pain management. Continue current rx. Rxs printed and given to pt today.

## 2015-11-11 ENCOUNTER — Ambulatory Visit: Payer: Worker's Compensation | Admitting: Occupational Therapy

## 2015-11-12 ENCOUNTER — Other Ambulatory Visit: Payer: Self-pay | Admitting: Emergency Medicine

## 2015-11-12 DIAGNOSIS — G90513 Complex regional pain syndrome I of upper limb, bilateral: Secondary | ICD-10-CM

## 2015-11-13 ENCOUNTER — Ambulatory Visit: Payer: Worker's Compensation | Attending: Emergency Medicine | Admitting: Occupational Therapy

## 2015-11-13 DIAGNOSIS — M79602 Pain in left arm: Secondary | ICD-10-CM

## 2015-11-13 DIAGNOSIS — M25642 Stiffness of left hand, not elsewhere classified: Secondary | ICD-10-CM | POA: Diagnosis present

## 2015-11-13 DIAGNOSIS — M6281 Muscle weakness (generalized): Secondary | ICD-10-CM | POA: Diagnosis present

## 2015-11-13 DIAGNOSIS — M25612 Stiffness of left shoulder, not elsewhere classified: Secondary | ICD-10-CM

## 2015-11-13 DIAGNOSIS — M25622 Stiffness of left elbow, not elsewhere classified: Secondary | ICD-10-CM

## 2015-11-13 DIAGNOSIS — M25632 Stiffness of left wrist, not elsewhere classified: Secondary | ICD-10-CM | POA: Diagnosis present
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
## 2015-11-13 NOTE — Patient Instructions (Signed)
Pt to do PROM to all digits extention -  Try after few days to do place and hold in extention , extrinsic , and intrinsic fist  PROM thumb PA and RA  Then place and hold  Try in few days if can do opposition - pick up 2 cm foam block alternate digits   2 x day 3-5 reps each   Then 2 x day if can do shoulder flexion , abd and extention 8 reps in supine or sitting  Elbow extention on combination with shoulder extention   Wrist extention and flexion  RD and UD  - on table on paper  Sup/pro  8 reps   1 x day  Work on keeping hand longer open at time - on lap, over armrest , or knee

## 2015-11-19 ENCOUNTER — Other Ambulatory Visit: Admission: RE | Admit: 2015-11-19 | Payer: BC Managed Care – PPO | Source: Ambulatory Visit

## 2015-11-19 ENCOUNTER — Ambulatory Visit
Admission: RE | Admit: 2015-11-19 | Discharge: 2015-11-19 | Disposition: A | Discharge status: Home / Self Care | Payer: Worker's Compensation | Source: Ambulatory Visit | Attending: Emergency Medicine | Admitting: Emergency Medicine

## 2015-11-19 ENCOUNTER — Other Ambulatory Visit
Admission: RE | Admit: 2015-11-19 | Discharge: 2015-11-19 | Disposition: A | Discharge status: Home / Self Care | Payer: Worker's Compensation | Source: Ambulatory Visit | Attending: Emergency Medicine | Admitting: Emergency Medicine

## 2015-11-19 ENCOUNTER — Encounter: Admission: RE | Admit: 2015-11-19 | Payer: Worker's Compensation | Source: Ambulatory Visit

## 2015-11-19 ENCOUNTER — Other Ambulatory Visit: Admit: 2015-11-19 | Payer: BC Managed Care – PPO

## 2015-11-19 DIAGNOSIS — G90512 Complex regional pain syndrome I of left upper limb: Secondary | ICD-10-CM | POA: Diagnosis not present

## 2015-11-19 DIAGNOSIS — G90513 Complex regional pain syndrome I of upper limb, bilateral: Secondary | ICD-10-CM

## 2015-11-19 DIAGNOSIS — Z0189 Encounter for other specified special examinations: Secondary | ICD-10-CM | POA: Insufficient documentation

## 2015-11-19 DIAGNOSIS — Z3200 Encounter for pregnancy test, result unknown: Secondary | ICD-10-CM | POA: Insufficient documentation

## 2015-11-19 LAB — URINALYSIS COMPLETE WITH MICROSCOPIC (ARMC ONLY)
BILIRUBIN URINE: NEGATIVE
Bacteria, UA: NONE SEEN
GLUCOSE, UA: NEGATIVE mg/dL
Hgb urine dipstick: NEGATIVE
KETONES UR: NEGATIVE mg/dL
Leukocytes, UA: NEGATIVE
NITRITE: NEGATIVE
PH: 7 (ref 5.0–8.0)
Protein, ur: 30 mg/dL — AB
Specific Gravity, Urine: 1.021 (ref 1.005–1.030)

## 2015-11-19 LAB — PREGNANCY, URINE: Preg Test, Ur: NEGATIVE

## 2015-11-19 MED ORDER — TECHNETIUM TC 99M MEDRONATE IV KIT
25.0000 | PACK | Freq: Once | INTRAVENOUS | Status: AC | PRN
  Administered 2015-11-19: 22.32 via INTRAVENOUS

## 2015-11-20 ENCOUNTER — Ambulatory Visit: Payer: Worker's Compensation | Attending: Emergency Medicine | Admitting: Occupational Therapy

## 2015-11-20 DIAGNOSIS — M25612 Stiffness of left shoulder, not elsewhere classified: Secondary | ICD-10-CM

## 2015-11-20 DIAGNOSIS — M25632 Stiffness of left wrist, not elsewhere classified: Secondary | ICD-10-CM | POA: Diagnosis present

## 2015-11-20 DIAGNOSIS — M6281 Muscle weakness (generalized): Secondary | ICD-10-CM | POA: Diagnosis present

## 2015-11-20 DIAGNOSIS — M79602 Pain in left arm: Secondary | ICD-10-CM

## 2015-11-20 DIAGNOSIS — M25622 Stiffness of left elbow, not elsewhere classified: Secondary | ICD-10-CM | POA: Diagnosis present

## 2015-11-20 DIAGNOSIS — M25642 Stiffness of left hand, not elsewhere classified: Secondary | ICD-10-CM
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
## 2015-11-20 NOTE — Patient Instructions (Signed)
HEP - same but do AAROM for shoulder flexion , extention of elbow against wall or in supine  Wrist same  Pt to do PROM to all digits extention - Try after few days to do place and hold in extention  Hold open around 4 cm foam block several times during day and try and carry it and walk 1 min x 3 day - and move to rolled up magazine if can  Swing arm to side  PROM thumb PA and RA  Then place and hold     2 x day 3-5 reps each

## 2015-11-22 ENCOUNTER — Ambulatory Visit: Payer: Worker's Compensation | Admitting: Occupational Therapy

## 2015-11-22 DIAGNOSIS — M79602 Pain in left arm: Secondary | ICD-10-CM

## 2015-11-22 DIAGNOSIS — M6281 Muscle weakness (generalized): Secondary | ICD-10-CM

## 2015-11-22 DIAGNOSIS — M25622 Stiffness of left elbow, not elsewhere classified: Secondary | ICD-10-CM

## 2015-11-22 DIAGNOSIS — M25612 Stiffness of left shoulder, not elsewhere classified: Secondary | ICD-10-CM

## 2015-11-22 DIAGNOSIS — M25632 Stiffness of left wrist, not elsewhere classified: Secondary | ICD-10-CM

## 2015-11-22 DIAGNOSIS — M25642 Stiffness of left hand, not elsewhere classified: Secondary | ICD-10-CM

## 2015-11-22 NOTE — Patient Instructions (Signed)
Same HEP but PROM for shoulder flexion in supine but place hand at upper arm  , heatingpad on elbow for full stretch - and AROM but in supination position  Scapula AROM retraction  And pect major stretch with towel roll under spine on floor 3 -10 min

## 2015-11-22 NOTE — Therapy (Signed)
[place] [facility] [address], [place], [address] Phone: [phone]   Fax:  [phone]  Occupational Therapy Treatment  Patient Details  Name: Holly Wolf MRN: [id number] Date of Birth: 11-21-1980 Referring Provider: RAO  Encounter Date: 11/22/2015      OT End of Session - 11/22/15 1137    Visit Number 3   Number of Visits 12   Date for OT Re-Evaluation 12/11/15   OT Start Time 1015   OT Stop Time 1101   OT Time Calculation (min) 46 min   Activity Tolerance Patient tolerated treatment well;Treatment limited secondary to medical complications (Comment);Patient limited by pain   Behavior During Therapy [facility] for tasks assessed/performed;Anxious      Past Medical History  Diagnosis Date  . 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 Frequent headaches   . Heart murmur   . Dysautonomia   . Anxiety   . Fatigue     Past Surgical History  Procedure Laterality Date  . Wisdom tooth extraction  1999-2000    There were no vitals filed for this visit.      Subjective Assessment - 11/22/15 1125    Subjective  I slept the whole day yesterday - after the bone scan and seeing you  my body just shutted  down - I am trying not to carry my hand on my chest or stomach   Patient Stated Goals I just want my life back - able to do the things that I did before - and using my arm and hand    Currently in Pain? Yes   Pain Score 10-Worst pain ever   Pain Location Arm   Pain Orientation Left   Pain Descriptors / Indicators Aching;Burning;Pins and needles;Shooting   Pain Type Chronic pain   Pain Onset More than a month ago         PROM and AAROM for  Shoulder flexion in supine  AROM shoulder ABD in supine  Supine heating pad on elbow for stretch into full extention of elbow - 5 min  AROM full extention of elbow on mat  8 reps each  heating pad around hand with 5 cm firm foam block in palm for stretch  Gentle  PROM of all digits and thumb - 2nd and thumb IP very tight  Place and hold all digits and thumb radial abd 3 sec x 5  Attempted stretch for 5 min with heating pad in webspace with thumb RA stretch - followed by PROM - unable to extend IP of thumb                         OT Education - 11/22/15 1137    Education provided Yes   Education Details HEP   Person(s) Educated Patient   Methods Explanation;Demonstration;Tactile cues;Verbal cues;Handout   Comprehension Returned demonstration;Verbal cues required;Verbalized understanding          OT Short Term Goals - 11/13/15 1704    OT SHORT TERM GOAL #1   Title Pt show increase L shoulder and elbow AROM to reach over head during ADL"s and IADL's    Baseline see flowsheet    Time 3   Period Weeks   Status New   OT SHORT TERM GOAL #2   Title Pt to be in HEP to increase PROM and AROM at L dgits , wrist , elbow and shoulder    Baseline very  little knowledge on HEP    Time 3   Period Weeks   Status New           OT Long Term Goals - 11/13/15 1707    OT LONG TERM GOAL #1   Title AROM in  L digits and wrist improve for pt to be able to use hand in 25% of ADL's    Baseline Only AROM in 5th digit, partially in 4th - And not able to use hand in any ADL's - hand stays in fist    OT LONG TERM GOAL #2   Title Pt to improve on PRWHE for functional use of L hand by at least 10-15 points    Baseline Function on PRWHE 44/50    Time 4   Period Weeks   Status New               Plan - 11/22/15 1137    Clinical Impression Statement Pt showed again some progress in posture in  waiting room with hand on armrest and external rotated but hand still in fist and thumb inside of thumb - pt to work on keeping hand more open but thumb out of palm - thumb IP flexion very tight and 2nd diigt - but did get full extention of elbow this date  - husband remind her this next  weekend to keep thumb out and arm to side   Rehab  Potential Fair   OT Frequency 3x / week   OT Duration 4 weeks   OT Treatment/Interventions Self-care/ADL training;Fluidtherapy;Moist Heat;DME and/or AE instruction;Splinting;Patient/family education;Therapeutic exercises;Passive range of motion;Manual Therapy   Plan assess progress, update HEP , thumb out of palm    OT Home Exercise Plan see pt instruction    Consulted and Agree with Plan of Care Patient      Patient will benefit from skilled therapeutic intervention in order to improve the following deficits and impairments:  Impaired flexibility, Decreased range of motion, Decreased coordination, Impaired sensation, Decreased endurance, Decreased activity tolerance, Decreased knowledge of precautions, Impaired UE functional use, Pain, Decreased strength  Visit Diagnosis: Pain In Left Arm  Stiffness of left hand, not elsewhere classified  Stiffness of left wrist, not elsewhere classified  Stiffness of left elbow, not elsewhere classified  Stiffness of left shoulder, not elsewhere classified  Muscle weakness (generalized)    Problem List Patient Active Problem List   Diagnosis Date Noted  . Weight loss, unintentional 08/29/2015  . Burn 07/03/2015  . Post herpetic neuralgia 11/14/2013  . POTS (postural orthostatic tachycardia syndrome) 11/14/2013  . History of migraine headaches 10/26/2013  . Dysautonomia 10/26/2013    Rosalyn Gess OTR/L,CLT  11/22/2015, 11:41 AM  [place] [facility] [address] [place], [address] Phone: [phone]   Fax:  [phone]  Name: Karrine Figaro MRN: [id number] Date of Birth: Aug 10, 1980

## 2015-11-25 ENCOUNTER — Ambulatory Visit: Payer: Worker's Compensation | Admitting: Occupational Therapy

## 2015-11-25 DIAGNOSIS — M25642 Stiffness of left hand, not elsewhere classified: Secondary | ICD-10-CM

## 2015-11-25 DIAGNOSIS — M6281 Muscle weakness (generalized): Secondary | ICD-10-CM

## 2015-11-25 DIAGNOSIS — M79602 Pain in left arm: Secondary | ICD-10-CM

## 2015-11-25 DIAGNOSIS — M25622 Stiffness of left elbow, not elsewhere classified: Secondary | ICD-10-CM

## 2015-11-25 DIAGNOSIS — M25632 Stiffness of left wrist, not elsewhere classified: Secondary | ICD-10-CM

## 2015-11-25 DIAGNOSIS — M25612 Stiffness of left shoulder, not elsewhere classified: Secondary | ICD-10-CM

## 2015-11-25 NOTE — Therapy (Signed)
[place] [facility] [address] [place], [address] Phone: [phone]   Fax:  [phone]  Occupational Therapy Treatment  Patient Details  Name: Holly Wolf MRN: [place]:[id number] Date of Birth: 1981-06-03 Referring Provider: RAO  Encounter Date: 11/25/2015      OT End of Session - 11/25/15 1649    Visit Number 4   Number of Visits 12   Date for OT Re-Evaluation 12/11/15   OT Start Time 0933   OT Stop Time 1017   OT Time Calculation (min) 44 min   Activity Tolerance Patient tolerated treatment well;Treatment limited secondary to medical complications (Comment);Patient limited by pain   Behavior During Therapy [facility] for tasks assessed/performed;Anxious      Past Medical History  Diagnosis Date  . 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 Frequent headaches   . Heart murmur   . Dysautonomia   . Anxiety   . Fatigue     Past Surgical History  Procedure Laterality Date  . Wisdom tooth extraction  1999-2000    There were no vitals filed for this visit.      Subjective Assessment - 11/25/15 0947    Subjective  This weekend was so bad  - it was busy with kids - pain off the scale -the tempatures difference was bad this weekend    Patient Stated Goals I just want my life back - able to do the things that I did before - and using my arm and hand    Currently in Pain? Yes   Pain Score 10-Worst pain ever   Pain Location Arm   Pain Orientation Left   Pain Descriptors / Indicators Aching;Burning;Shooting                      OT Treatments/Exercises (OP) - 11/25/15 0001    LUE Fluidotherapy   Number Minutes Fluidotherapy 12 Minutes   LUE Fluidotherapy Location Hand;Wrist;Forearm   Comments AROM for digits, wrist , at [facility]       After fluido digits more relax but thumb  Still inside fist  Gentle PROM of all digits and thumb - 2nd and thumb IP very tight but able to get full extention but  increase pain  5 reps x 2  And end with putting rond object in hand to keep hand open for 2 min  Adding 2 x wrist extention with digits open 75% of range  Thumb start tremor couple of times and had to take break - because of increase pain  Several rest breaks in between  Place and hold all digits and thumb radial abd 2 x 5 reps - hold 3 sec     AROM extention of elbow in standing to hand width from wall 2 x 5 sec  And then to place and hold 5 inch object on wall 2 x 30 sec   In sitting do shoulder AAROM slides on wall - 6 reps up to 125 degrees   Pt ed desensitization of L arm - rubbing with lotion several times during day - 30 sec - 3 sec over radial side of hand and then soft texture              OT Education - 11/25/15 1649    Education provided Yes   Education Details HEP   Person(s) Educated Patient   Methods Explanation;Demonstration;Tactile cues;Verbal cues;Handout   Comprehension Returned demonstration;Verbal cues required;Verbalized understanding  OT Short Term Goals - 11/13/15 1704    OT SHORT TERM GOAL #1   Title Pt show increase L shoulder and elbow AROM to reach over head during ADL"s and IADL's    Baseline see flowsheet    Time 3   Period Weeks   Status New   OT SHORT TERM GOAL #2   Title Pt to be in HEP to increase PROM and AROM at L dgits , wrist , elbow and shoulder    Baseline very little knowledge on HEP    Time 3   Period Weeks   Status New           OT Long Term Goals - 11/13/15 1707    OT LONG TERM GOAL #1   Title AROM in  L digits and wrist improve for pt to be able to use hand in 25% of ADL's    Baseline Only AROM in 5th digit, partially in 4th - And not able to use hand in any ADL's - hand stays in fist    OT LONG TERM GOAL #2   Title Pt to improve on PRWHE for functional use of L hand by at least 10-15 points    Baseline Function on PRWHE 44/50    Time 4   Period Weeks   Status New               Plan -  11/25/15 1650    Clinical Impression Statement Pt showed increaes digits AROM after fluido - thumb still tight but was able to get full extntion of IP thumb and 2nd digit - pt showed increase elbow extnetion on wall able to place and hold object against wall - did add some digits open in combinatio of wrist extnetion  - and add to HEP - also ed on desentitization - pt willing to try every exerices suggested    Rehab Potential Fair   OT Frequency 3x / week   OT Duration 4 weeks   OT Treatment/Interventions Self-care/ADL training;Fluidtherapy;Moist Heat;DME and/or AE instruction;Splinting;Patient/family education;Therapeutic exercises;Passive range of motion;Manual Therapy   Plan assess progress and upgrade to tolerance   OT Home Exercise Plan see pt instruction    Consulted and Agree with Plan of Care Patient      Patient will benefit from skilled therapeutic intervention in order to improve the following deficits and impairments:  Impaired flexibility, Decreased range of motion, Decreased coordination, Impaired sensation, Decreased endurance, Decreased activity tolerance, Decreased knowledge of precautions, Impaired UE functional use, Pain, Decreased strength  Visit Diagnosis: Pain In Left Arm  Stiffness of left hand, not elsewhere classified  Stiffness of left wrist, not elsewhere classified  Stiffness of left elbow, not elsewhere classified  Stiffness of left shoulder, not elsewhere classified  Muscle weakness (generalized)    Problem List Patient Active Problem List   Diagnosis Date Noted  . Weight loss, unintentional 08/29/2015  . Burn 07/03/2015  . Post herpetic neuralgia 11/14/2013  . POTS (postural orthostatic tachycardia syndrome) 11/14/2013  . History of migraine headaches 10/26/2013  . Dysautonomia 10/26/2013    Rosalyn Gess OTR/L,CLT  11/25/2015, 4:53 PM  [place] [facility] [address],  [place], [address] Phone: [phone]   Fax:  [phone]  Name: Holly Wolf MRN: [id number] Date of Birth: 09/20/1980

## 2015-11-25 NOTE — Patient Instructions (Addendum)
Cont same HEP but  with putting round object in hand to keep hand open for 2 min and pt to combine   wrist extention with digits open 75% of range    AROM extention of elbow in standing to hand width from wall 2 x 5 sec  And then to place and hold 5 inch object on wall 2 x 30 sec   In sitting do shoulder AAROM slides on wall - 6 reps up to 125 degrees   And ed on desentitization - rubbing arm and hand with lotion several times - 30 sec and less over radial hand  And then soft cloth or fabric

## 2015-11-27 ENCOUNTER — Ambulatory Visit: Payer: Worker's Compensation | Admitting: Occupational Therapy

## 2015-11-29 ENCOUNTER — Ambulatory Visit: Payer: Worker's Compensation | Admitting: Occupational Therapy

## 2015-11-29 DIAGNOSIS — M79602 Pain in left arm: Secondary | ICD-10-CM | POA: Diagnosis not present

## 2015-11-29 DIAGNOSIS — M25632 Stiffness of left wrist, not elsewhere classified: Secondary | ICD-10-CM

## 2015-11-29 DIAGNOSIS — M6281 Muscle weakness (generalized): Secondary | ICD-10-CM

## 2015-11-29 DIAGNOSIS — M25642 Stiffness of left hand, not elsewhere classified: Secondary | ICD-10-CM

## 2015-11-29 DIAGNOSIS — M25622 Stiffness of left elbow, not elsewhere classified: Secondary | ICD-10-CM

## 2015-11-29 DIAGNOSIS — M25612 Stiffness of left shoulder, not elsewhere classified: Secondary | ICD-10-CM

## 2015-11-29 NOTE — Patient Instructions (Addendum)
Pt to do PROM for shoulder flexion in supine and then place and hold  5 reps each   Try and roll with open hand over ball 6-inch ball  Cont with desensitization and previous HEP

## 2015-11-29 NOTE — Therapy (Signed)
[place] [facility] [address], [place], [address] Phone: [phone]   Fax:  [phone]  Occupational Therapy Treatment  Patient Details  Name: Holly Wolf MRN: [place]:[id number] Date of Birth: 06-Oct-1980 Referring Provider: RAO  Encounter Date: 11/29/2015      OT End of Session - 11/29/15 1754    OT Start Time 0933   OT Stop Time 1020   OT Time Calculation (min) 47 min   Activity Tolerance Patient tolerated treatment well;Treatment limited secondary to medical complications (Comment);Patient limited by pain   Behavior During Therapy [facility] for tasks assessed/performed;Anxious      Past Medical History  Diagnosis Date  . 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 Frequent headaches   . Heart murmur   . Dysautonomia   . Anxiety   . Fatigue     Past Surgical History  Procedure Laterality Date  . Wisdom tooth extraction  1999-2000    There were no vitals filed for this visit.      Subjective Assessment - 11/29/15 1750    Subjective  Since Tuesday -  I was not doing good - stayed home in bed Wednesday - my POTS was just not good - also could not do as much with my exerices -did change to rougher texture of blanket and did wash my arm    Patient Stated Goals I just want my life back - able to do the things that I did before - and using my arm and hand    Currently in Pain? Yes   Pain Score 10-Worst pain ever   Pain Location Arm   Pain Orientation Left                      OT Treatments/Exercises (OP) - 11/29/15 0001    LUE Fluidotherapy   Number Minutes Fluidotherapy 10 Minutes   LUE Fluidotherapy Location Hand;Wrist;Forearm   Comments AROM of digits , wrist prior to PROM of digits and thumb       Heating pad for 8 min over elbow and shoulder in supine  PROM for shoulder flexion  5 reps , Flexion shoulder place and hold  5 reps each - able to get 135 flexion of shoulder  Elbow  extention into table in supine 8 reps    fluido  Done with AROM for digits and wrist attempted   PROM of all digits and thumb - 2nd and thumb IP  tight but able to get full extention but increase pain  5 reps  Place on 6 inch ball - roll for slight pull and then place and hold digits in extention  Then attempted 2nd time - digits was more loose but could not hold it - pull away because pain  And end with putting rond object in hand to keep hand open for 2 min  Adding 2 x wrist extention with digits open 75% of range    Pt ed desensitization of L arm - rubbing with lotion several times during day - 30 sec - 3 sec over radial side of hand and then soft texture              OT Education - 11/29/15 1754    Education provided Yes   Education Details HEP update   Person(s) Educated Patient   Methods Explanation;Demonstration;Tactile cues;Verbal cues   Comprehension Verbalized understanding;Returned demonstration          OT  Short Term Goals - 11/13/15 1704    OT SHORT TERM GOAL #1   Title Pt show increase L shoulder and elbow AROM to reach over head during ADL"s and IADL's    Baseline see flowsheet    Time 3   Period Weeks   Status New   OT SHORT TERM GOAL #2   Title Pt to be in HEP to increase PROM and AROM at L dgits , wrist , elbow and shoulder    Baseline very little knowledge on HEP    Time 3   Period Weeks   Status New           OT Long Term Goals - 11/13/15 1707    OT LONG TERM GOAL #1   Title AROM in  L digits and wrist improve for pt to be able to use hand in 25% of ADL's    Baseline Only AROM in 5th digit, partially in 4th - And not able to use hand in any ADL's - hand stays in fist    OT LONG TERM GOAL #2   Title Pt to improve on PRWHE for functional use of L hand by at least 10-15 points    Baseline Function on PRWHE 44/50    Time 4   Period Weeks   Status New               Plan - 11/29/15 1755    Clinical Impression Statement Pt  was able to do more PROM of shoulder flexion on L , and able to do full elbow extnetoin - 2nd time with attempt to have hand open and rolling over ball - digits was more relax but pain wise pt was not able to tolerate it    Rehab Potential Fair   OT Frequency 3x / week   OT Duration 2 weeks   OT Treatment/Interventions Self-care/ADL training;Fluidtherapy;Moist Heat;DME and/or AE instruction;Splinting;Patient/family education;Therapeutic exercises;Passive range of motion;Manual Therapy   Plan assess progress in shoulder flexoin and digits/thumb    OT Home Exercise Plan see pt instruction    Consulted and Agree with Plan of Care Patient      Patient will benefit from skilled therapeutic intervention in order to improve the following deficits and impairments:  Impaired flexibility, Decreased range of motion, Decreased coordination, Impaired sensation, Decreased endurance, Decreased activity tolerance, Decreased knowledge of precautions, Impaired UE functional use, Pain, Decreased strength  Visit Diagnosis: Pain In Left Arm  Stiffness of left hand, not elsewhere classified  Stiffness of left wrist, not elsewhere classified  Stiffness of left elbow, not elsewhere classified  Stiffness of left shoulder, not elsewhere classified  Muscle weakness (generalized)    Problem List Patient Active Problem List   Diagnosis Date Noted  . Weight loss, unintentional 08/29/2015  . Burn 07/03/2015  . Post herpetic neuralgia 11/14/2013  . POTS (postural orthostatic tachycardia syndrome) 11/14/2013  . History of migraine headaches 10/26/2013  . Dysautonomia 10/26/2013    Rosalyn Gess OTR/L,CLT  11/29/2015, 6:12 PM  [place] [facility] [address] [place], [address] Phone: [phone]   Fax:  [phone]  Name: Holly Wolf MRN: [place]:[id number] Date of Birth: March 12, 1981

## 2015-12-04 ENCOUNTER — Ambulatory Visit: Payer: Worker's Compensation | Admitting: Occupational Therapy

## 2015-12-04 DIAGNOSIS — M25642 Stiffness of left hand, not elsewhere classified: Secondary | ICD-10-CM

## 2015-12-04 DIAGNOSIS — M25612 Stiffness of left shoulder, not elsewhere classified: Secondary | ICD-10-CM

## 2015-12-04 DIAGNOSIS — M79602 Pain in left arm: Secondary | ICD-10-CM | POA: Diagnosis not present

## 2015-12-04 DIAGNOSIS — M6281 Muscle weakness (generalized): Secondary | ICD-10-CM

## 2015-12-04 DIAGNOSIS — M25622 Stiffness of left elbow, not elsewhere classified: Secondary | ICD-10-CM

## 2015-12-04 DIAGNOSIS — M25632 Stiffness of left wrist, not elsewhere classified: Secondary | ICD-10-CM

## 2015-12-04 NOTE — Therapy (Signed)
[place] [facility] [address] [place], [address] Phone: [phone]   Fax:  [phone]  Occupational Therapy Treatment  Patient Details  Name: Holly Wolf MRN: [id number] Date of Birth: February 09, 1981 Referring Provider: RAO  Encounter Date: 12/04/2015      OT End of Session - 12/04/15 0830    Visit Number 5   Number of Visits 12   Date for OT Re-Evaluation 12/11/15   OT Start Time 0803   OT Stop Time 0854   OT Time Calculation (min) 51 min   Activity Tolerance Patient tolerated treatment well;Treatment limited secondary to medical complications (Comment);Patient limited by pain   Behavior During Therapy [facility] for tasks assessed/performed;Anxious      Past Medical History  Diagnosis Date  . 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 Frequent headaches   . Heart murmur   . Dysautonomia   . Anxiety   . Fatigue     Past Surgical History  Procedure Laterality Date  . Wisdom tooth extraction  1999-2000    There were no vitals filed for this visit.      Subjective Assessment - 12/04/15 0818    Subjective  It is easier to move my arm , high intensity pain is higher - tyring to use it more , try to fold laundry with my pinkie - I do try to do the rubbing / lotion but not making difference for pain    Patient Stated Goals I just want my life back - able to do the things that I did before - and using my arm and hand    Currently in Pain? Yes   Pain Score 10-Worst pain ever   Pain Location Arm   Pain Orientation Left   Pain Descriptors / Indicators Aching;Shooting   Pain Type Chronic pain   Pain Onset More than a month ago   Pain Frequency Constant            [facility] OT Assessment - 12/04/15 0001    AROM   Left Shoulder Extension 58 Degrees   Left Shoulder Flexion 140 Degrees   Left Shoulder ABduction 140 Degrees   Left Elbow Extension -15   Left Wrist Extension 58 Degrees   Left Wrist Flexion  35 Degrees   Left Wrist Radial Deviation 27 Degrees   Left Wrist Ulnar Deviation 13 Degrees                  OT Treatments/Exercises (OP) - 12/04/15 0001    LUE Fluidotherapy   Number Minutes Fluidotherapy 10 Minutes   LUE Fluidotherapy Location Hand;Wrist;Forearm   Comments AROM for wrist sup/ wrist flexion/ext and digist ROM     Measurements taken for PN for MD - appt tomorrow - great progress but pain increased   fluido done with AROM for digits and wrist  PROM for digits did not tolerate very well this date  - increase spasm and thumb and 2nd digit  didshow increase AROM coming in at rest - but able to keep in hand 4cm foam block only 30 sec   shoulders this date more rounded   Place on 6 inch ball - roll for one attempt but unable to keep digits open   HEP focus on retraction,   wrist extention with digits open 75% of range  AROM  Cont with  Pt ed desensitization of L arm - rubbing with lotion several times during day - 30  sec - 3 sec over radial side of hand and then soft texture  And keep hand including thumb open as long as she can               OT Education - 12/04/15 0830    Education provided Yes   Education Details HEP   Person(s) Educated Patient   Methods Explanation;Demonstration;Tactile cues   Comprehension Returned demonstration;Verbalized understanding          OT Short Term Goals - 12/04/15 1141    OT SHORT TERM GOAL #1   Title Pt show increase L shoulder and elbow AROM to reach over head during ADL"s and IADL's    Baseline increase ROM but not able to use hand    Time 3   Period Weeks   Status On-going   OT SHORT TERM GOAL #2   Title Pt to be in HEP to increase PROM and AROM at L dgits , wrist , elbow and shoulder    Baseline doing good with HEP    Time 3   Period Weeks   Status On-going           OT Long Term Goals - 12/04/15 1142    OT LONG TERM GOAL #1   Title AROM in  L digits and wrist improve for pt to be  able to use hand in 25% of ADL's    Baseline no able to use hand yet - little bit pinkie    Time 3   Period Weeks   Status On-going   OT LONG TERM GOAL #2   Title Pt to improve on PRWHE for functional use of L hand by at least 10-15 points    Baseline Function on PRWHE 44/50 - no functional hand yet -    Time 4   Period Weeks   Status On-going               Plan - 12/04/15 0830    Clinical Impression Statement Pt do show increase AROM from shoudler to hand but also increase pain per pt - never under 10/10 and needs restbreaks - pt still unable to use hand for weightbearing , or carrying any object - no even roll up paper or magazine - did had increase spasm after flludo this date - pt to see MD tomorrow and will let me know about folllowing appt    OT Frequency 3x / week   OT Duration 2 weeks   OT Treatment/Interventions Self-care/ADL training;Fluidtherapy;Moist Heat;DME and/or AE instruction;Splinting;Patient/family education;Therapeutic exercises;Passive range of motion;Manual Therapy   Plan Pt to see MD tomorrow-    OT Home Exercise Plan see pt instruction    Consulted and Agree with Plan of Care Patient      Patient will benefit from skilled therapeutic intervention in order to improve the following deficits and impairments:  Impaired flexibility, Decreased range of motion, Decreased coordination, Impaired sensation, Decreased endurance, Decreased activity tolerance, Decreased knowledge of precautions, Impaired UE functional use, Pain, Decreased strength  Visit Diagnosis: Pain In Left Arm  Stiffness of left hand, not elsewhere classified  Stiffness of left wrist, not elsewhere classified  Stiffness of left elbow, not elsewhere classified  Stiffness of left shoulder, not elsewhere classified  Muscle weakness (generalized)    Problem List Patient Active Problem List   Diagnosis Date Noted  . Weight loss, unintentional 08/29/2015  . Burn 07/03/2015  . Post  herpetic neuralgia 11/14/2013  . POTS (postural orthostatic tachycardia syndrome) 11/14/2013  .  History of migraine headaches 10/26/2013  . Dysautonomia 10/26/2013    Rosalyn Gess OTR/L,CLT  12/04/2015, 11:43 AM  [place] [facility] [address] [place], [address] Phone: [phone]   Fax:  [phone]  Name: Farida Pentz MRN: :[id number] Date of Birth: 04-20-1981

## 2015-12-04 NOTE — Patient Instructions (Signed)
Heating pad for 8 min over elbow and shoulder in supine  PROM for shoulder flexion 5 reps , Flexion shoulder place and hold  5 reps each - able to get 135 flexion of shoulder  Elbow extention into table in supine 8 reps   fluido Done with AROM for digits and wrist attempted   PROM of all digits and thumb - 2nd and thumb IP tight but able to get full extention but increase pain  5 reps  Place on 6 inch ball - roll for slight pull and then place and hold digits in extention  Then attempted 2nd time - digits was more loose but could not hold it - pull away because pain  And end with putting rond object in hand to keep hand open for 2 min  Adding 2 x wrist extention with digits open 75% of range    Pt ed desensitization of L arm - rubbing with lotion several times during day - 30 sec - 3 sec over radial side of hand and then soft texture

## 2015-12-04 NOTE — Patient Instructions (Addendum)
HEP focus on retraction,   wrist extention with digits open 75% of range  AROM  Cont with  Pt ed desensitization of L arm - rubbing with lotion several times during day - 30 sec - 3 sec over radial side of hand and then soft texture  And keep hand including thumb open as long as she can

## 2015-12-09 ENCOUNTER — Ambulatory Visit: Payer: Worker's Compensation | Admitting: Occupational Therapy

## 2015-12-10 ENCOUNTER — Ambulatory Visit: Payer: Worker's Compensation | Admitting: Occupational Therapy

## 2015-12-13 ENCOUNTER — Encounter: Payer: Worker's Compensation | Admitting: Occupational Therapy

## 2015-12-17 ENCOUNTER — Encounter: Payer: Worker's Compensation | Admitting: Occupational Therapy

## 2015-12-20 ENCOUNTER — Encounter: Payer: Worker's Compensation | Admitting: Occupational Therapy

## 2016-02-12 ENCOUNTER — Ambulatory Visit: Payer: Worker's Compensation | Attending: Emergency Medicine | Admitting: Occupational Therapy

## 2016-02-12 DIAGNOSIS — M25622 Stiffness of left elbow, not elsewhere classified: Secondary | ICD-10-CM | POA: Diagnosis present

## 2016-02-12 DIAGNOSIS — M6281 Muscle weakness (generalized): Secondary | ICD-10-CM | POA: Insufficient documentation

## 2016-02-12 DIAGNOSIS — M25632 Stiffness of left wrist, not elsewhere classified: Secondary | ICD-10-CM | POA: Diagnosis present

## 2016-02-12 DIAGNOSIS — M79602 Pain in left arm: Secondary | ICD-10-CM | POA: Diagnosis present

## 2016-02-12 DIAGNOSIS — M25642 Stiffness of left hand, not elsewhere classified: Secondary | ICD-10-CM | POA: Insufficient documentation

## 2016-02-12 DIAGNOSIS — M25612 Stiffness of left shoulder, not elsewhere classified: Secondary | ICD-10-CM | POA: Diagnosis present

## 2016-02-12 NOTE — Patient Instructions (Signed)
Scapula retraction stretch - lying in supine   pect stretch   With cervical rotation R and L  Pendulum for L shoulder  Few times during day -   2 x 4 cm foam block to slide into hand to increase digits extention  And thumb to out side of hand - even 2-3 sec at time

## 2016-02-12 NOTE — Therapy (Signed)
[facility] [address], [place], [address] Phone: [phone]   Fax:  [phone]  Occupational Therapy Treatment  Patient Details  Name: Holly Wolf MRN: [id number] Date of Birth: Oct 25, 1980 Referring Provider: RAO  Encounter Date: 02/12/2016      OT End of Session - 02/12/16 1733    Visit Number 1   Number of Visits 12   Date for OT Re-Evaluation 03/25/16   OT Start Time 1135   OT Stop Time 1233   OT Time Calculation (min) 58 min   Activity Tolerance Patient tolerated treatment well;Treatment limited secondary to medical complications (Comment);Patient limited by pain   Behavior During Therapy [facility] for tasks assessed/performed;Anxious      Past Medical History:  Diagnosis Date  . Anxiety   . 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 Dysautonomia   . Fatigue   . Frequent headaches   . Heart murmur     Past Surgical History:  Procedure Laterality Date  . [place] EXTRACTION  1999-2000    There were no vitals filed for this visit.      Subjective Assessment - 02/12/16 1727    Subjective  Since I seen you in May - I had 3 nerve blockes - they did not help -  my POTS did not to well with it - and my pain and range of motion is now worse - even into my neck , chest , ribcase and upper back    Patient Stated Goals I just want my life back - able to do the things that I did before - and using my arm and hand    Currently in Pain? Yes   Pain Score 10-Worst pain ever   Pain Location Arm   Pain Orientation Left   Pain Descriptors / Indicators Aching;Burning;Constant;Guarding;Stabbing;Spasm;Shooting;Sharp   Pain Type Chronic pain   Pain Onset Other (comment)  Since Sept 2016   Pain Frequency Constant       Pt ed on AROM - pendulum for shoulder flexion and ABD   circular  Lying in supine - pect stretch ,shoulder retraction , With cervical rotation L and R   Doing some PROM to  digits - to get 2 by 4 cm foam block into hand for few sec to minutes And thumb PROM to out side of hand - even 2 sec at time                        OT Education - 02/12/16 1733    Education provided Yes   Education Details HEP    Person(s) Educated Patient   Methods Explanation;Demonstration;Tactile cues;Verbal cues;Handout   Comprehension Verbal cues required;Returned demonstration;Verbalized understanding          OT Short Term Goals - 02/12/16 1912      OT SHORT TERM GOAL #1   Title Pt show increase L shoulder and elbow AROM to reach over head during ADL"s and IADL's - shaving and applying deodorant under L arm   Baseline L shoulder ext 40 , flexion 45, ,elbow flexion 130 , extention -55   Time 4   Period Weeks   Status New     OT SHORT TERM GOAL #2   Title Pt to be in HEP to increase PROM and AROM at L dgits , wrist , elbow and shoulder    Baseline Cannot tolerate any ROM to thumb , PROM by  herself 25 % to digits,  - do not tolerate any PROM to wrist ,elbow and shoulder   Time 4   Period Weeks           OT Long Term Goals - 02/12/16 1920      OT LONG TERM GOAL #1   Title AROM in  L digits and wrist improve for pt to be able to hold or carry 2 inch object for 2 min    Baseline keep hand in fist - do by herself 25% PROM to 2nd thru 5th - but then increase pain and nausea - unable to tolerate any PROM to thumb - or by OT to digits   Time 6   Period Weeks   Status New     OT LONG TERM GOAL #2   Title Pt to improve on PRWHE for functional use of L hand by at least 10-15 points    Baseline Function score 50/50 - non functional hand   Time 6   Period Weeks   Status New               Plan - 02/12/16 1736    Clinical Impression Statement 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 - pt was seen  the last time  in May and was then send for 3 nerve blocks - in end of June and beginning of July - pt report that it did  not help for pain and   made her POTS worse -  and her  pain is now in neck , chest, ribs  and upper back - and present with  L shouder elevated by close to 2 inches,  protracted , shoulder, elbow , wrist and digits AROM worsen  compare to May -  unable to do any PROM to digits or thumb by OT - pt guarded with increaes pain and nausea  - present with non functional hand   Rehab Potential Fair   OT Frequency 2x / week   OT Duration 6 weeks   OT Treatment/Interventions Self-care/ADL training;Fluidtherapy;Moist Heat;DME and/or AE instruction;Splinting;Patient/family education;Therapeutic exercises;Passive range of motion;Manual Therapy   Plan assess if was able to do HEP    OT Home Exercise Plan see pt instruction    Consulted and Agree with Plan of Care Patient      Patient will benefit from skilled therapeutic intervention in order to improve the following deficits and impairments:  Impaired flexibility, Decreased range of motion, Decreased coordination, Impaired sensation, Decreased endurance, Decreased activity tolerance, Decreased knowledge of precautions, Impaired UE functional use, Pain, Decreased strength  Visit Diagnosis: Pain In Left Arm - Plan: Ot plan of care cert/re-cert  Stiffness of left hand, not elsewhere classified - Plan: Ot plan of care cert/re-cert  Stiffness of left wrist, not elsewhere classified - Plan: Ot plan of care cert/re-cert  Stiffness of left elbow, not elsewhere classified - Plan: Ot plan of care cert/re-cert  Stiffness of left shoulder, not elsewhere classified - Plan: Ot plan of care cert/re-cert  Muscle weakness (generalized) - Plan: Ot plan of care cert/re-cert    Problem List Patient Active Problem List   Diagnosis Date Noted  . Weight loss, unintentional 08/29/2015  . Burn 07/03/2015  . Post herpetic neuralgia 11/14/2013  . POTS (postural orthostatic tachycardia syndrome) 11/14/2013  . History of migraine headaches 10/26/2013  . Dysautonomia 10/26/2013    Rosalyn Gess OTR/L,CLT  02/12/2016, 7:32 PM  [place] [address], [place], [address] Phone: [phone]   Fax:  [phone]  Name: Holly Wolf MRN: [id number] Date of Birth: 1981/01/14

## 2016-02-17 ENCOUNTER — Ambulatory Visit: Payer: Worker's Compensation | Admitting: Occupational Therapy

## 2016-02-17 DIAGNOSIS — M6281 Muscle weakness (generalized): Secondary | ICD-10-CM

## 2016-02-17 DIAGNOSIS — M25632 Stiffness of left wrist, not elsewhere classified: Secondary | ICD-10-CM

## 2016-02-17 DIAGNOSIS — M25612 Stiffness of left shoulder, not elsewhere classified: Secondary | ICD-10-CM

## 2016-02-17 DIAGNOSIS — M79602 Pain in left arm: Secondary | ICD-10-CM | POA: Diagnosis not present

## 2016-02-17 DIAGNOSIS — M25642 Stiffness of left hand, not elsewhere classified: Secondary | ICD-10-CM

## 2016-02-17 NOTE — Therapy (Signed)
[place] [facility] [address], [place], [address] Phone: [phone]   Fax:  [phone]  Occupational Therapy Treatment  Patient Details  Name: Holly Wolf MRN: [id number] Date of Birth: 04-Nov-1980 Referring Provider: RAO  Encounter Date: 02/17/2016      OT End of Session - 02/17/16 2005    Visit Number 2   Number of Visits 12   Date for OT Re-Evaluation 03/25/16   OT Start Time 1455   OT Stop Time 1545   OT Time Calculation (min) 50 min   Activity Tolerance Patient tolerated treatment well;Treatment limited secondary to medical complications (Comment);Patient limited by pain   Behavior During Therapy [facility] for tasks assessed/performed;Anxious      Past Medical History:  Diagnosis Date  . Anxiety   . 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 Dysautonomia   . Fatigue   . Frequent headaches   . Heart murmur     Past Surgical History:  Procedure Laterality Date  . [place] EXTRACTION  1999-2000    There were no vitals filed for this visit.      Subjective Assessment - 02/17/16 2000    Subjective  My husband worked on my shoulders and neck - and  I tried to do the shoulder exercises  but my POTS don't let me do it - I get dizzy - could get my fingers out to fit 1 inch foam block few times - did not time- cannot get my thumb out of hand -   Patient Stated Goals I just want my life back - able to do the things that I did before - and using my arm and hand    Currently in Pain? Yes   Pain Score 10-Worst pain ever   Pain Location Arm   Pain Orientation Left   Pain Descriptors / Indicators Aching;Burning;Constant;Guarding;Stabbing;Spasm;Sharp   Pain Type Chronic pain   Pain Onset More than a month ago         Supine heating pad on L arm and shoulder/ant chest- unable to tolerate heat at shoulder   positioning elbow to side and some external rotation during heat- adjusted several times with  pillow folded double and then elbow extended some more   sitting  AROM for ext rotation and shoulder abd  8 reps Did some shoulder flexion and extnetion with scapula retraction and protraction by OT   10 reps  Add to HEP   heating pad around hand followed by PROM to digits extention partially by pt - 4 reps  Ad place 2cm firm foam block in palm for stretch - 1 1/2 min    Attempted ROM shoulder flexion with elbow extention reaching for knee - but unable                         OT Education - 02/17/16 2004    Education provided Yes   Education Details HEP   Person(s) Educated Patient   Methods Explanation;Demonstration;Tactile cues;Verbal cues   Comprehension Verbal cues required;Returned demonstration;Verbalized understanding          OT Short Term Goals - 02/12/16 1912      OT SHORT TERM GOAL #1   Title Pt show increase L shoulder and elbow AROM to reach over head during ADL"s and IADL's - shaving and applying deodorant under L arm   Baseline L shoulder ext 40 , flexion 45, ,elbow flexion 130 ,  extention -55   Time 4   Period Weeks   Status New     OT SHORT TERM GOAL #2   Title Pt to be in HEP to increase PROM and AROM at L dgits , wrist , elbow and shoulder    Baseline Cannot tolerate any ROM to thumb , PROM by herself 25 % to digits,  - do not tolerate any PROM to wrist ,elbow and shoulder   Time 4   Period Weeks           OT Long Term Goals - 02/12/16 1920      OT LONG TERM GOAL #1   Title AROM in  L digits and wrist improve for pt to be able to hold or carry 2 inch object for 2 min    Baseline keep hand in fist - do by herself 25% PROM to 2nd thru 5th - but then increase pain and nausea - unable to tolerate any PROM to thumb - or by OT to digits   Time 6   Period Weeks   Status New     OT LONG TERM GOAL #2   Title Pt to improve on PRWHE for functional use of L hand by at least 10-15 points    Baseline Function score 50/50 - non  functional hand   Time 6   Period Weeks   Status New               Plan - 02/17/16 2006    Clinical Impression Statement Pt can tolerate heat and postioning in stretch but no PROM by OT - increase pain , dizziness and [place] - did come with shoulders even this date but cervical AROM still impaired and shoulder, elbow and hand - cannot push any PROM because of pt driving - and  would not be able to drive when leaving clinic    Rehab Potential Fair   OT Frequency 2x / week   OT Duration 6 weeks   OT Treatment/Interventions Self-care/ADL training;Fluidtherapy;Moist Heat;DME and/or AE instruction;Splinting;Patient/family education;Therapeutic exercises;Passive range of motion;Manual Therapy   Plan discuss if husband can come with her few sessions  for driving so OT can do more PROM    OT Home Exercise Plan see pt instruction    Consulted and Agree with Plan of Care Patient      Patient will benefit from skilled therapeutic intervention in order to improve the following deficits and impairments:  Impaired flexibility, Decreased range of motion, Decreased coordination, Impaired sensation, Decreased endurance, Decreased activity tolerance, Decreased knowledge of precautions, Impaired UE functional use, Pain, Decreased strength  Visit Diagnosis: Pain In Left Arm  Stiffness of left hand, not elsewhere classified  Stiffness of left wrist, not elsewhere classified  Stiffness of left shoulder, not elsewhere classified  Muscle weakness (generalized)    Problem List Patient Active Problem List   Diagnosis Date Noted  . Weight loss, unintentional 08/29/2015  . Burn 07/03/2015  . Post herpetic neuralgia 11/14/2013  . POTS (postural orthostatic tachycardia syndrome) 11/14/2013  . History of migraine headaches 10/26/2013  . Dysautonomia 10/26/2013    Rosalyn Gess OTR/L,CLT 02/17/2016, 8:10 PM  [facility] [address], [place], [address] Phone: [phone]   Fax:  [phone]  Name: Holly Wolf MRN: [id number] Date of Birth: Nov 30, 1980

## 2016-02-17 NOTE — Patient Instructions (Signed)
Pt to work out relaxing shoulder  Pendulum  shoulder Let gravity relax elbow down when sitting  Pt to do some AAROM for shoulder flexion and extention with scapula retraction and protraction with assistance from husband PROM for digits extnetion - and fo[place] in - time  And thumb AROM for extention out of fist

## 2016-02-20 ENCOUNTER — Ambulatory Visit: Payer: Worker's Compensation | Attending: Emergency Medicine | Admitting: Occupational Therapy

## 2016-02-20 DIAGNOSIS — M79602 Pain in left arm: Secondary | ICD-10-CM

## 2016-02-20 DIAGNOSIS — M25622 Stiffness of left elbow, not elsewhere classified: Secondary | ICD-10-CM | POA: Diagnosis present

## 2016-02-20 DIAGNOSIS — M25632 Stiffness of left wrist, not elsewhere classified: Secondary | ICD-10-CM | POA: Insufficient documentation

## 2016-02-20 DIAGNOSIS — M25642 Stiffness of left hand, not elsewhere classified: Secondary | ICD-10-CM | POA: Insufficient documentation

## 2016-02-20 DIAGNOSIS — M6281 Muscle weakness (generalized): Secondary | ICD-10-CM

## 2016-02-20 DIAGNOSIS — M25612 Stiffness of left shoulder, not elsewhere classified: Secondary | ICD-10-CM | POA: Insufficient documentation

## 2016-02-20 NOTE — Therapy (Signed)
[facility] [address], [place], [address] Phone: [phone]   Fax:  [phone]  Occupational Therapy Treatment  Patient Details  Name: Holly Wolf MRN: [id number] Date of Birth: [birth year] Referring Provider: RAO  Encounter Date: 02/20/2016      OT End of Session - 02/20/16 1005    Visit Number 3   Number of Visits 12   Date for OT Re-Evaluation 03/25/16   OT Start Time 0903   OT Stop Time 0950   OT Time Calculation (min) 47 min   Activity Tolerance Patient tolerated treatment well;Treatment limited secondary to medical complications (Comment);Patient limited by pain   Behavior During Therapy [facility] for tasks assessed/performed;Anxious      Past Medical History:  Diagnosis Date  . Anxiety   . 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 Dysautonomia   . Fatigue   . Frequent headaches   . Heart murmur     Past Surgical History:  Procedure Laterality Date  . [place] EXTRACTION  1999-2000    There were no vitals filed for this visit.      Subjective Assessment - 02/20/16 0959    Subjective  My POTS and pain was really bad the last 2 days- yesterday I did not even get out of bed - I did had fibromyalgia before my first pregnancy -and POTS after pregnancy    Patient Stated Goals I just want my life back - able to do the things that I did before - and using my arm and hand    Currently in Pain? Yes   Pain Score 10-Worst pain ever   Pain Location Arm   Pain Orientation Left   Pain Descriptors / Indicators Aching;Burning;Constant;Guarding;Spasm   Pain Type Chronic pain     Heat on L arm - with stretch into elbow partial extention and ABD with pillow   AROM reaching to hip with elbow extention  10 reps    Contract and relax with shoulder flexion and ABD in supine - 75 ABD and 100 flexion this date   Supination AAROM and AROM - full pronation - to neutral - resist with PROM  Wrist  extetention and flexion AROM and AAROM - to neutral - no extention this date - resist too for PROM  Reaching back and fw- with scapula retraction and protraction in sitting - contract and relax Open thumb PROM - and 2 cm firm foam block - for 30 sec to 1 min few times during day  Cont with previous HEP   Pain and POTS limited how much she can tolerate                          OT Education - 02/20/16 1005    Education provided Yes   Education Details HEP   Methods Explanation;Demonstration;Tactile cues;Verbal cues   Comprehension Verbal cues required;Returned demonstration;Verbalized understanding          OT Short Term Goals - 02/12/16 1912      OT SHORT TERM GOAL #1   Title Pt show increase L shoulder and elbow AROM to reach over head during ADL"s and IADL's - shaving and applying deodorant under L arm   Baseline L shoulder ext 40 , flexion 45, ,elbow flexion 130 , extention -55   Time 4   Period Weeks   Status New     OT SHORT TERM GOAL #2  Title Pt to be in HEP to increase PROM and AROM at L dgits , wrist , elbow and shoulder    Baseline Cannot tolerate any ROM to thumb , PROM by herself 25 % to digits,  - do not tolerate any PROM to wrist ,elbow and shoulder   Time 4   Period Weeks           OT Long Term Goals - 02/12/16 1920      OT LONG TERM GOAL #1   Title AROM in  L digits and wrist improve for pt to be able to hold or carry 2 inch object for 2 min    Baseline keep hand in fist - do by herself 25% PROM to 2nd thru 5th - but then increase pain and nausea - unable to tolerate any PROM to thumb - or by OT to digits   Time 6   Period Weeks   Status New     OT LONG TERM GOAL #2   Title Pt to improve on PRWHE for functional use of L hand by at least 10-15 points    Baseline Function score 50/50 - non functional hand   Time 6   Period Weeks   Status New               Plan - 02/20/16 1006    Clinical Impression Statement Pt was able  to tolerate more AAROM , contract and relax and AROM to L shoulder this date  - in  shoulder flexion, protraction,retraction  of scapula - did get thumb out of fist and 2 cm firm foam block for 30 sec - pt showing increase shoulder AROM this date but wrist and elbow still impaired - shoulder was not elevated this am    Rehab Potential Good   OT Frequency 2x / week   OT Duration 4 weeks   OT Treatment/Interventions Self-care/ADL training;Fluidtherapy;Moist Heat;DME and/or AE instruction;Splinting;Patient/family education;Therapeutic exercises;Passive range of motion;Manual Therapy   Plan assess progress - appear if am better with POTS meds ?   OT Home Exercise Plan see pt instruction    Consulted and Agree with Plan of Care Patient      Patient will benefit from skilled therapeutic intervention in order to improve the following deficits and impairments:  Impaired flexibility, Decreased range of motion, Decreased coordination, Impaired sensation, Decreased endurance, Decreased activity tolerance, Decreased knowledge of precautions, Impaired UE functional use, Pain, Decreased strength  Visit Diagnosis: Stiffness of left hand, not elsewhere classified  Stiffness of left wrist, not elsewhere classified  Stiffness of left shoulder, not elsewhere classified  Stiffness of left elbow, not elsewhere classified  Muscle weakness (generalized)  Pain In Left Arm    Problem List Patient Active Problem List   Diagnosis Date Noted  . Weight loss, unintentional 08/29/2015  . Burn 07/03/2015  . Post herpetic neuralgia 11/14/2013  . POTS (postural orthostatic tachycardia syndrome) 11/14/2013  . History of migraine headaches 10/26/2013  . Dysautonomia 10/26/2013    Rosalyn Gess OTR/L,CLT  02/20/2016, 10:10 AM  [facility] [facility] [address] [place], [address] Phone: [phone]   Fax:  [phone]  Name: Jeneal Hickmott MRN: [id number] Date of Birth: 08-19-1980

## 2016-02-20 NOTE — Patient Instructions (Signed)
Contract and relax with shoulder flexion and ABD in supine   Supination AAROM and AROM  Wrist extetention and flexion AROM and AAROM  Reaching back and fw- with scapula retraction and protraction  Open thumb PROM - and 2 cm firm foam block - for 30 sec to 1 min few times during day  Cont with previous

## 2016-02-25 ENCOUNTER — Ambulatory Visit: Payer: Worker's Compensation | Admitting: Occupational Therapy

## 2016-02-25 DIAGNOSIS — M25632 Stiffness of left wrist, not elsewhere classified: Secondary | ICD-10-CM

## 2016-02-25 DIAGNOSIS — M25612 Stiffness of left shoulder, not elsewhere classified: Secondary | ICD-10-CM

## 2016-02-25 DIAGNOSIS — M79602 Pain in left arm: Secondary | ICD-10-CM

## 2016-02-25 DIAGNOSIS — M25622 Stiffness of left elbow, not elsewhere classified: Secondary | ICD-10-CM

## 2016-02-25 DIAGNOSIS — M25642 Stiffness of left hand, not elsewhere classified: Secondary | ICD-10-CM | POA: Diagnosis not present

## 2016-02-25 DIAGNOSIS — M6281 Muscle weakness (generalized): Secondary | ICD-10-CM
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
## 2016-02-25 NOTE — Patient Instructions (Signed)
Same HEP for shoulder, elbow , wrist ,and hand

## 2016-02-27 ENCOUNTER — Ambulatory Visit: Payer: Worker's Compensation | Admitting: Occupational Therapy

## 2016-03-13 ENCOUNTER — Ambulatory Visit (INDEPENDENT_AMBULATORY_CARE_PROVIDER_SITE_OTHER): Payer: BC Managed Care – PPO | Admitting: Family Medicine

## 2016-03-13 ENCOUNTER — Encounter: Payer: Self-pay | Admitting: Family Medicine

## 2016-03-13 VITALS — BP 104/68 | HR 64 | Temp 98.4°F | Ht 64.0 in | Wt 110.5 lb

## 2016-03-13 DIAGNOSIS — R350 Frequency of micturition: Secondary | ICD-10-CM | POA: Diagnosis not present

## 2016-03-13 DIAGNOSIS — R300 Dysuria: Secondary | ICD-10-CM | POA: Diagnosis not present

## 2016-03-13 LAB — POC URINALSYSI DIPSTICK (AUTOMATED)
BILIRUBIN UA: NEGATIVE
GLUCOSE UA: NEGATIVE
KETONES UA: NEGATIVE
Leukocytes, UA: NEGATIVE
Nitrite, UA: NEGATIVE
RBC UA: NEGATIVE
Urobilinogen, UA: 0.2
pH, UA: 6

## 2016-03-13 NOTE — Progress Notes (Signed)
Pre visit review using our clinic review tool, if applicable. No additional management support is needed unless otherwise documented below in the visit note. 

## 2016-03-13 NOTE — Progress Notes (Signed)
   Subjective:    Patient ID: Holly Wolf, female    DOB: 06-27-1981, 35 y.o.   MRN: [id number]  Urinary Frequency   The current episode started in the past 7 days. The problem has been gradually worsening. The quality of the pain is described as burning. The pain is moderate. There has been no fever. She is sexually active. There is a history of pyelonephritis. Associated symptoms include frequency, nausea and vomiting. Pertinent negatives include no chills, discharge, flank pain, hematuria, hesitancy, possible pregnancy or urgency. Associated symptoms comments: N/V at her baseline Has chronic pain, no new abdominal pain and flank pain. She has tried increased fluids for the symptoms. The treatment provided no relief. Her past medical history is significant for recurrent UTIs. There is no history of catheterization, kidney stones, a single kidney or a urological procedure.  Dysuria   Associated symptoms include frequency, nausea and vomiting. Pertinent negatives include no chills, discharge, flank pain, hematuria, hesitancy, possible pregnancy or urgency. Her past medical history is significant for recurrent UTIs. There is no history of catheterization, kidney stones, a single kidney or a urological procedure.      Review of Systems  Constitutional: Negative for chills.  Gastrointestinal: Positive for nausea and vomiting.  Genitourinary: Positive for dysuria and frequency. Negative for flank pain, hematuria, hesitancy and urgency.       Objective:   Physical Exam  Constitutional: Vital signs are normal. She appears well-developed and well-nourished. She is cooperative.  Non-toxic appearance. She does not appear ill. No distress.  HENT:  Head: Normocephalic.  Right Ear: Hearing, tympanic membrane, external ear and ear canal normal. Tympanic membrane is not erythematous, not retracted and not bulging.  Left Ear: Hearing, tympanic membrane, external ear and ear canal normal. Tympanic  membrane is not erythematous, not retracted and not bulging.  Nose: No mucosal edema or rhinorrhea. Right sinus exhibits no maxillary sinus tenderness and no frontal sinus tenderness. Left sinus exhibits no maxillary sinus tenderness and no frontal sinus tenderness.  Mouth/Throat: Uvula is midline, oropharynx is clear and moist and mucous membranes are normal.  Eyes: Conjunctivae, EOM and lids are normal. Pupils are equal, round, and reactive to light. Lids are everted and swept, no foreign bodies found.  Neck: Trachea normal and normal range of motion. Neck supple. Carotid bruit is not present. No thyroid mass and no thyromegaly present.  Cardiovascular: Normal rate, regular rhythm, S1 normal, S2 normal, normal heart sounds, intact distal pulses and normal pulses.  Exam reveals no gallop and no friction rub.   No murmur heard. Pulmonary/Chest: Effort normal and breath sounds normal. No tachypnea. No respiratory distress. She has no decreased breath sounds. She has no wheezes. She has no rhonchi. She has no rales.  Abdominal: Soft. Normal appearance and bowel sounds are normal. There is no hepatosplenomegaly. There is tenderness in the suprapubic area. There is rebound. There is no rigidity, no guarding and no CVA tenderness.  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  Neurological: She is alert.  Skin: Skin is warm, dry and intact. No rash noted.  Psychiatric: Her speech is normal and behavior is normal. Judgment and thought content normal. Her mood appears not anxious. Cognition and memory are normal. She does not exhibit a depressed mood.          Assessment & Plan:

## 2016-03-13 NOTE — Patient Instructions (Signed)
Increase water.  Avoid bladder irritants such as citris, tomatos, acidic foods and drinks, tobacco, alcohol, caffeine, spicy foods. Call PCP if not improving as expected or symptoms change.

## 2016-03-13 NOTE — Assessment & Plan Note (Signed)
UA clear.  Pt with chronic pain syndrome. ? Bladder irritation. Will avoid bladder irritants and increase water. Per pt no concern for STDs, no vaginal symptoms.

## 2016-05-04 ENCOUNTER — Encounter: Payer: Self-pay | Admitting: Family Medicine

## 2016-05-04 ENCOUNTER — Ambulatory Visit (INDEPENDENT_AMBULATORY_CARE_PROVIDER_SITE_OTHER): Payer: BC Managed Care – PPO | Admitting: Family Medicine

## 2016-05-04 VITALS — BP 142/98 | HR 74 | Temp 97.9°F | Wt 108.0 lb

## 2016-05-04 DIAGNOSIS — B351 Tinea unguium: Secondary | ICD-10-CM

## 2016-05-04 DIAGNOSIS — Z23 Encounter for immunization: Secondary | ICD-10-CM

## 2016-05-04 LAB — HEPATIC FUNCTION PANEL
ALT: 14 U/L (ref 0–35)
AST: 20 U/L (ref 0–37)
Albumin: 4.4 g/dL (ref 3.5–5.2)
Alkaline Phosphatase: 32 U/L — ABNORMAL LOW (ref 39–117)
BILIRUBIN TOTAL: 0.4 mg/dL (ref 0.2–1.2)
Bilirubin, Direct: 0.1 mg/dL (ref 0.0–0.3)
Total Protein: 7.2 g/dL (ref 6.0–8.3)

## 2016-05-04 MED ORDER — BUTALBITAL-APAP-CAFF-COD 50-325-40-30 MG PO CAPS: 1.0000 | ORAL_CAPSULE | ORAL | 0 refills | Status: DC | PRN

## 2016-05-04 MED ORDER — TERBINAFINE HCL 250 MG PO TABS: 250.0000 mg | ORAL_TABLET | Freq: Every day | ORAL | 4 refills | Status: DC

## 2016-05-04 MED ORDER — CLONAZEPAM 1 MG PO TABS: 1.0000 mg | ORAL_TABLET | Freq: Four times a day (QID) | ORAL | 0 refills | Status: DC | PRN

## 2016-05-04 NOTE — Patient Instructions (Signed)
Great to see you.  Take lamisil as directed- 1 tablet daily.  Come seem in 8 weeks.

## 2016-05-04 NOTE — Assessment & Plan Note (Signed)
New- failed topical rx. eRx sent for oral lamisil. Follow up in 8 weeks. Check LFTS today. The patient indicates understanding of these issues and agrees with the plan.

## 2016-05-04 NOTE — Progress Notes (Signed)
Subjective:   Patient ID: Holly Wolf, female    DOB: 1981/01/28, 35 y.o.   MRN: [id number]  Holly Wolf is a pleasant 35 y.o. year old female who presents to clinic today with Nail Problem (right foot)  on 05/04/2016  HPI:  Yellowing, thickening of several toe nails on right foot. Now becoming a little painful. No redness around the nail bed.  Has tried topical lamisil but it continues to worsen. Lab Results  Component Value Date   ALT 13 04/13/2014   AST 17 04/13/2014   ALKPHOS 30 (L) 04/13/2014   BILITOT 0.6 04/13/2014   Current Outpatient Prescriptions on File Prior to Visit  Medication Sig Dispense Refill  . bisoprolol (ZEBETA) 5 MG tablet Take 0.5 tablets (2.5 mg total) by mouth daily.    . cyclobenzaprine (FLEXERIL) 10 MG tablet Take 1 tablet (10 mg total) by mouth 3 (three) times daily as needed. 30 tablet 5  . Diclofenac Potassium 50 MG PACK Take by mouth as needed.    . frovatriptan (FROVA) 2.5 MG tablet Take 1 tablet (2.5 mg total) by mouth as needed for migraine. If recurs, may repeat after 2 hours. Max of 3 tabs in 24 hours. 10 tablet 0  . HYDROcodone-acetaminophen (NORCO/VICODIN) 5-325 MG tablet Take 1 tablet by mouth every 6 (six) hours as needed for moderate pain. 30 tablet 0  . ketorolac (TORADOL) 10 MG tablet Take 10 mg by mouth as needed (up to twice a month).     . midodrine (PROAMATINE) 2.5 MG tablet Take 2.5 mg by mouth 3 (three) times daily as needed.    . ondansetron (ZOFRAN) 4 MG tablet Take 4 mg by mouth every 8 (eight) hours as needed for nausea or vomiting.    . promethazine (PHENERGAN) 25 MG tablet Take 25 mg by mouth every 6 (six) hours as needed for nausea or vomiting.    . propranolol (INDERAL) 10 MG tablet Take 10 mg by mouth. Take 1/4 to 1/2 tab daily prn for hear rate spikes above 110    . QUEtiapine (SEROQUEL) 25 MG tablet Take 25 mg by mouth at bedtime as needed.    . SUMAtriptan (IMITREX) 100 MG tablet Take 100 mg by mouth every 2  (two) hours as needed for migraine or headache. May repeat in 2 hours if headache persists or recurs.    . SUMAtriptan Succinate (SUMAVEL DOSEPRO) 6 MG/0.5ML SOTJ Inject 6 mg into the skin. Use as directed. Use no more than two days per week    . traZODone (DESYREL) 100 MG tablet Take 100-200 mg by mouth at bedtime.     No current facility-administered medications on file prior to visit.     No Known Allergies  Past Medical History:  Diagnosis Date  . Anxiety   . 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 Dysautonomia   . Fatigue   . Frequent headaches   . Heart murmur     Past Surgical History:  Procedure Laterality Date  . [place] TO[place] EXTRACTION  1999-2000    Family History  Problem Relation Age of Onset  . Hypertension Mother   . COPD Mother   . Cancer Mother   . Stroke Mother   . Hyperlipidemia Father   . Hypertension Sister   . Hyperlipidemia Sister   . Arthritis Maternal Grandmother   . Heart disease Maternal Grandmother   . Heart disease Maternal Grandfather     Social History   Social History  .  Marital status: Married    Spouse name: N/A  . Number of children: N/A  . Years of education: N/A   Occupational History  . Not on file.   Social History Main Topics  . Smoking status: Never Smoker  . Smokeless tobacco: Never Used  . Alcohol use Yes  . Drug use: No  . Sexual activity: Yes   Other Topics Concern  . Not on file   Social History Narrative  . No narrative on file   The PMH, PSH, Social History, Family History, Medications, and allergies have been reviewed in [facility], and have been updated if relevant.   Review of Systems  Constitutional: Negative.   Skin: Negative.   All other systems reviewed and are negative.      Objective:    BP (!) 142/98   Pulse 74   Temp 97.9 F (36.6 C) (Oral)   Wt 108 lb (49 kg)   SpO2 98%   BMI 18.54 kg/m    Physical Exam  Constitutional: She is oriented to person, place, and time. She appears  well-developed and well-nourished. No distress.  HENT:  Head: Normocephalic.  Eyes: Conjunctivae are normal.  Cardiovascular: Normal rate.   Pulmonary/Chest: Effort normal.  Musculoskeletal: Normal range of motion.  Neurological: She is alert and oriented to person, place, and time. No cranial nerve deficit. Coordination normal.  Skin: She is not diaphoretic.     Psychiatric: She has a normal mood and affect. Her behavior is normal. Judgment and thought content normal.  Nursing note and vitals reviewed.         Assessment & Plan:   Onychomycosis No Follow-up on file.

## 2016-05-04 NOTE — Progress Notes (Signed)
Pre visit review using our clinic review tool, if applicable. No additional management support is needed unless otherwise documented below in the visit note. 

## 2016-05-04 NOTE — Addendum Note (Signed)
Addended by: Modena Nunnery on: 05/04/2016 08:34 AM   Modules accepted: Orders

## 2016-07-06 ENCOUNTER — Ambulatory Visit (INDEPENDENT_AMBULATORY_CARE_PROVIDER_SITE_OTHER): Payer: BC Managed Care – PPO | Admitting: Family Medicine

## 2016-07-06 ENCOUNTER — Encounter: Payer: Self-pay | Admitting: Family Medicine

## 2016-07-06 VITALS — BP 120/72 | HR 84 | Temp 98.3°F | Wt 106.0 lb

## 2016-07-06 DIAGNOSIS — G901 Familial dysautonomia [Riley-Day]: Secondary | ICD-10-CM

## 2016-07-06 DIAGNOSIS — R000 Tachycardia, unspecified: Secondary | ICD-10-CM | POA: Diagnosis not present

## 2016-07-06 DIAGNOSIS — I951 Orthostatic hypotension: Secondary | ICD-10-CM

## 2016-07-06 DIAGNOSIS — B0229 Other postherpetic nervous system involvement: Secondary | ICD-10-CM

## 2016-07-06 DIAGNOSIS — T300 Burn of unspecified body region, unspecified degree: Secondary | ICD-10-CM | POA: Diagnosis not present

## 2016-07-06 DIAGNOSIS — R634 Abnormal weight loss: Secondary | ICD-10-CM

## 2016-07-06 DIAGNOSIS — G909 Disorder of the autonomic nervous system, unspecified: Secondary | ICD-10-CM

## 2016-07-06 DIAGNOSIS — B351 Tinea unguium: Secondary | ICD-10-CM | POA: Diagnosis not present

## 2016-07-06 DIAGNOSIS — G90A Postural orthostatic tachycardia syndrome (POTS): Secondary | ICD-10-CM

## 2016-07-06 LAB — HEPATIC FUNCTION PANEL
ALBUMIN: 4.5 g/dL (ref 3.5–5.2)
ALT: 10 U/L (ref 0–35)
AST: 14 U/L (ref 0–37)
Alkaline Phosphatase: 36 U/L — ABNORMAL LOW (ref 39–117)
Bilirubin, Direct: 0.1 mg/dL (ref 0.0–0.3)
Total Bilirubin: 0.4 mg/dL (ref 0.2–1.2)
Total Protein: 6.9 g/dL (ref 6.0–8.3)

## 2016-07-06 MED ORDER — HYDROCODONE-ACETAMINOPHEN 5-325 MG PO TABS: 1.0000 | ORAL_TABLET | Freq: Four times a day (QID) | ORAL | 0 refills | Status: DC | PRN

## 2016-07-06 MED ORDER — BUTALBITAL-APAP-CAFF-COD 50-325-40-30 MG PO CAPS: 1.0000 | ORAL_CAPSULE | ORAL | 0 refills | Status: DC | PRN

## 2016-07-06 MED ORDER — CLONAZEPAM 1 MG PO TABS: 1.0000 mg | ORAL_TABLET | Freq: Four times a day (QID) | ORAL | 0 refills | Status: DC | PRN

## 2016-07-06 NOTE — Assessment & Plan Note (Signed)
Followed by Dr. Mechele Dawley. MRIs scheduled for tomorrow. Also scheduled for brachial block.

## 2016-07-06 NOTE — Assessment & Plan Note (Signed)
Improving. Check LFTs today.

## 2016-07-06 NOTE — Patient Instructions (Addendum)
Great to see you. Happy Holidays.  Please keep me updated.

## 2016-07-06 NOTE — Assessment & Plan Note (Signed)
Persistent issue. She is still taking supplements.

## 2016-07-06 NOTE — Progress Notes (Signed)
Pre visit review using our clinic review tool, if applicable. No additional management support is needed unless otherwise documented below in the visit note. 
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
## 2016-07-21 DIAGNOSIS — Z01419 Encounter for gynecological examination (general) (routine) without abnormal findings: Secondary | ICD-10-CM | POA: Diagnosis not present

## 2016-07-24 DIAGNOSIS — R000 Tachycardia, unspecified: Secondary | ICD-10-CM | POA: Diagnosis not present

## 2016-07-24 DIAGNOSIS — I951 Orthostatic hypotension: Secondary | ICD-10-CM | POA: Diagnosis not present

## 2016-09-03 DIAGNOSIS — M79602 Pain in left arm: Secondary | ICD-10-CM | POA: Insufficient documentation

## 2016-10-26 ENCOUNTER — Other Ambulatory Visit: Payer: Self-pay | Admitting: Family Medicine

## 2016-12-30 ENCOUNTER — Telehealth: Payer: Self-pay

## 2016-12-30 NOTE — Telephone Encounter (Signed)
Pt left v/m requesting referral to [place]. Pt  Last seen for f/u on 07/06/16. Pt said gradually feeling more anxious and depressed. Pt said sometimes feels like could harm herself or someone else. Last had thoughts of harming herself and or some one else on 12/28/16. Pt is crying, pts husband is with her and pts husband will take pt to [facility] ED to be evaluated. Fyi to Dr Deborra Medina.

## 2017-01-29 DIAGNOSIS — I951 Orthostatic hypotension: Secondary | ICD-10-CM | POA: Diagnosis not present

## 2017-01-29 DIAGNOSIS — R000 Tachycardia, unspecified: Secondary | ICD-10-CM | POA: Diagnosis not present

## 2017-03-01 ENCOUNTER — Ambulatory Visit (HOSPITAL_COMMUNITY): Payer: Self-pay | Admitting: Psychiatry

## 2017-05-11 ENCOUNTER — Encounter: Payer: Self-pay | Admitting: Family

## 2017-05-11 ENCOUNTER — Ambulatory Visit (INDEPENDENT_AMBULATORY_CARE_PROVIDER_SITE_OTHER): Payer: BC Managed Care – PPO | Admitting: Family

## 2017-05-11 VITALS — BP 136/76 | HR 68 | Temp 98.6°F | Ht 64.0 in | Wt 106.2 lb

## 2017-05-11 DIAGNOSIS — F411 Generalized anxiety disorder: Secondary | ICD-10-CM | POA: Diagnosis not present

## 2017-05-11 DIAGNOSIS — R000 Tachycardia, unspecified: Secondary | ICD-10-CM

## 2017-05-11 DIAGNOSIS — Z8669 Personal history of other diseases of the nervous system and sense organs: Secondary | ICD-10-CM

## 2017-05-11 DIAGNOSIS — G90512 Complex regional pain syndrome I of left upper limb: Secondary | ICD-10-CM

## 2017-05-11 DIAGNOSIS — I951 Orthostatic hypotension: Secondary | ICD-10-CM

## 2017-05-11 DIAGNOSIS — G90A Postural orthostatic tachycardia syndrome (POTS): Secondary | ICD-10-CM

## 2017-05-11 MED ORDER — CLONAZEPAM 1 MG PO TABS: 1.0000 mg | ORAL_TABLET | Freq: Every day | ORAL | 0 refills | Status: DC | PRN

## 2017-05-11 MED ORDER — BUTALBITAL-APAP-CAFF-COD 50-325-40-30 MG PO CAPS: 1.0000 | ORAL_CAPSULE | ORAL | 0 refills | Status: DC | PRN

## 2017-05-11 MED ORDER — ONDANSETRON 4 MG PO TBDP: 4.0000 mg | ORAL_TABLET | Freq: Three times a day (TID) | ORAL | 0 refills | Status: DC | PRN

## 2017-05-11 NOTE — Assessment & Plan Note (Addendum)
Stable, unchanged. Discussed at great length with patient her complex history and multiple of controlled substance medications.  My concern is first her safety and two to ensure no drug-drug interactions. I am unfamiliar with her regimen particularly for abortive therapy for headache and advised that I would be most comfortable with patient establishing and following with neurology for refills. I agreed to refill during the interim as she appears to have an excellent understanding of medications and prn use as well as duplicate medications however going forward have asked these medications to come from neurology.Referral to neurology in [place] as patient is unable to drive long distances. I looked up patient on [place] Controlled Substances  Reporting System and saw no activity that raised concern of inappropriate use.

## 2017-05-11 NOTE — Patient Instructions (Signed)
Again, I want you to be safe and think consult with neurology and their following will be safest in management of your migraines.   Pleasure meeting you  Please return for physical

## 2017-05-11 NOTE — Progress Notes (Signed)
Pre visit review using our clinic review tool, if applicable. No additional management support is needed unless otherwise documented below in the visit note. 
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
## 2017-05-12 DIAGNOSIS — G90519 Complex regional pain syndrome I of unspecified upper limb: Secondary | ICD-10-CM | POA: Insufficient documentation

## 2017-05-12 NOTE — Assessment & Plan Note (Signed)
Stable. Unchanged.

## 2017-07-26 DIAGNOSIS — Z01419 Encounter for gynecological examination (general) (routine) without abnormal findings: Secondary | ICD-10-CM | POA: Diagnosis not present

## 2017-12-02 ENCOUNTER — Telehealth: Payer: Self-pay

## 2017-12-02 NOTE — Telephone Encounter (Signed)
Copied from [place]. Topic: Inquiry >> Dec 02, 2017  4:49 PM Neva Seat wrote: Pt wants to establish care w/ Dr. Deborra Medina.  Pt is currently a pt of  Margaret Arnett's .  She was told by M. Arnett that she doesn't feel comfortable taking her on as a pt.   Pt is now wanting to transfer care to Dr. Deborra Medina.

## 2017-12-03 NOTE — Telephone Encounter (Signed)
That is fine 

## 2017-12-03 NOTE — Telephone Encounter (Signed)
Yes transfer is okay with me. 

## 2017-12-03 NOTE — Telephone Encounter (Signed)
Transfer to Dr. Deborra Medina is approved.

## 2017-12-03 NOTE — Telephone Encounter (Signed)
Awaiting Dr. Hulen Shouts approval of transfer.

## 2017-12-03 NOTE — Telephone Encounter (Signed)
Please advise on transfer.  °

## 2017-12-30 ENCOUNTER — Other Ambulatory Visit: Payer: Self-pay

## 2017-12-30 ENCOUNTER — Encounter: Payer: Self-pay | Admitting: Family Medicine

## 2017-12-30 ENCOUNTER — Ambulatory Visit (INDEPENDENT_AMBULATORY_CARE_PROVIDER_SITE_OTHER): Payer: BC Managed Care – PPO | Admitting: Family Medicine

## 2017-12-30 VITALS — BP 108/50 | HR 63 | Temp 99.2°F | Ht 65.0 in | Wt 107.6 lb

## 2017-12-30 DIAGNOSIS — G90A Postural orthostatic tachycardia syndrome (POTS): Secondary | ICD-10-CM

## 2017-12-30 DIAGNOSIS — G90512 Complex regional pain syndrome I of left upper limb: Secondary | ICD-10-CM

## 2017-12-30 DIAGNOSIS — R000 Tachycardia, unspecified: Secondary | ICD-10-CM

## 2017-12-30 DIAGNOSIS — I951 Orthostatic hypotension: Secondary | ICD-10-CM

## 2017-12-30 DIAGNOSIS — Z8669 Personal history of other diseases of the nervous system and sense organs: Secondary | ICD-10-CM

## 2017-12-30 DIAGNOSIS — Z23 Encounter for immunization: Secondary | ICD-10-CM

## 2017-12-30 DIAGNOSIS — F411 Generalized anxiety disorder: Secondary | ICD-10-CM

## 2017-12-30 LAB — COMPREHENSIVE METABOLIC PANEL
ALK PHOS: 33 U/L — AB (ref 39–117)
ALT: 11 U/L (ref 0–35)
AST: 15 U/L (ref 0–37)
Albumin: 4.7 g/dL (ref 3.5–5.2)
BILIRUBIN TOTAL: 0.5 mg/dL (ref 0.2–1.2)
BUN: 9 mg/dL (ref 6–23)
CALCIUM: 9.8 mg/dL (ref 8.4–10.5)
CO2: 29 mEq/L (ref 19–32)
CREATININE: 0.82 mg/dL (ref 0.40–1.20)
Chloride: 102 mEq/L (ref 96–112)
GFR: 82.86 mL/min (ref >60.00)
Glucose, Bld: 87 mg/dL (ref 70–99)
Potassium: 4.6 mEq/L (ref 3.5–5.1)
Sodium: 137 mEq/L (ref 135–145)
TOTAL PROTEIN: 7.3 g/dL (ref 6.0–8.3)

## 2017-12-30 LAB — TSH: TSH: 0.87 u[IU]/mL (ref 0.35–4.50)

## 2017-12-30 MED ORDER — CLONAZEPAM 1 MG PO TABS: 1.0000 mg | ORAL_TABLET | Freq: Every day | ORAL | 3 refills | Status: DC | PRN

## 2017-12-30 MED ORDER — TERBINAFINE HCL 250 MG PO TABS: 250.0000 mg | ORAL_TABLET | Freq: Every day | ORAL | 4 refills | Status: DC

## 2017-12-30 MED ORDER — BUTALBITAL-APAP-CAFF-COD 50-325-40-30 MG PO CAPS: 1.0000 | ORAL_CAPSULE | ORAL | 3 refills | Status: DC | PRN

## 2017-12-30 MED ORDER — HYDROCODONE-ACETAMINOPHEN 5-325 MG PO TABS: 1.0000 | ORAL_TABLET | Freq: Four times a day (QID) | ORAL | 0 refills | Status: DC | PRN

## 2017-12-30 MED ORDER — TRETINOIN 0.01 % EX GEL: Freq: Every day | CUTANEOUS | 0 refills | Status: DC

## 2017-12-30 NOTE — Patient Instructions (Signed)
Great to see you. I will call you with your lab results from today and you can view them online.   
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
## 2017-12-30 NOTE — Assessment & Plan Note (Addendum)
Followed by Dr. Rip Harbour. She has follow up scheduled in July.

## 2017-12-31 ENCOUNTER — Telehealth: Payer: Self-pay

## 2017-12-31 NOTE — Telephone Encounter (Signed)
PA initiated via [facility] for Tretinoin/thx dmf

## 2018-01-04 NOTE — Telephone Encounter (Signed)
PA approved from 6.14.19-6.14.2022/thx dmf

## 2018-02-04 DIAGNOSIS — R000 Tachycardia, unspecified: Secondary | ICD-10-CM | POA: Diagnosis not present

## 2018-02-04 DIAGNOSIS — I951 Orthostatic hypotension: Secondary | ICD-10-CM | POA: Diagnosis not present

## 2018-02-04 DIAGNOSIS — G909 Disorder of the autonomic nervous system, unspecified: Secondary | ICD-10-CM | POA: Diagnosis not present

## 2018-02-25 ENCOUNTER — Ambulatory Visit (INDEPENDENT_AMBULATORY_CARE_PROVIDER_SITE_OTHER): Payer: BC Managed Care – PPO | Admitting: Primary Care

## 2018-02-25 ENCOUNTER — Encounter: Payer: Self-pay | Admitting: Primary Care

## 2018-02-25 VITALS — BP 122/82 | HR 65 | Temp 98.1°F | Ht 65.0 in | Wt 109.2 lb

## 2018-02-25 DIAGNOSIS — R350 Frequency of micturition: Secondary | ICD-10-CM | POA: Diagnosis not present

## 2018-02-25 DIAGNOSIS — N3001 Acute cystitis with hematuria: Secondary | ICD-10-CM | POA: Diagnosis not present

## 2018-02-25 LAB — POC URINALSYSI DIPSTICK (AUTOMATED)
BILIRUBIN UA: NEGATIVE
GLUCOSE UA: NEGATIVE
Ketones, UA: NEGATIVE
Nitrite, UA: NEGATIVE
Protein, UA: NEGATIVE
SPEC GRAV UA: 1.02 (ref 1.010–1.025)
Urobilinogen, UA: 0.2 E.U./dL
pH, UA: 6.5 (ref 5.0–8.0)

## 2018-02-25 MED ORDER — CIPROFLOXACIN HCL 500 MG PO TABS: 500.0000 mg | ORAL_TABLET | Freq: Two times a day (BID) | ORAL | 0 refills | Status: DC

## 2018-02-25 NOTE — Patient Instructions (Signed)
Start ciprofloxacin antibiotics for the infection. Take 1 tablet by mouth twice daily for 5 days.  Make sure to push intake of water and rest.  Okay to start the UroSTAT medication for symptoms.  It was a pleasure meeting you!
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
## 2018-02-25 NOTE — Addendum Note (Signed)
Addended by: Jacqualin Combes on: 02/25/2018 04:01 PM   Modules accepted: Orders

## 2018-02-27 LAB — URINE CULTURE
MICRO NUMBER:: 90945905
SPECIMEN QUALITY:: ADEQUATE

## 2018-07-27 DIAGNOSIS — Z30431 Encounter for routine checking of intrauterine contraceptive device: Secondary | ICD-10-CM | POA: Diagnosis not present

## 2018-07-27 DIAGNOSIS — Z01419 Encounter for gynecological examination (general) (routine) without abnormal findings: Secondary | ICD-10-CM | POA: Diagnosis not present

## 2018-07-27 DIAGNOSIS — Z681 Body mass index (BMI) 19 or less, adult: Secondary | ICD-10-CM | POA: Diagnosis not present

## 2018-08-11 DIAGNOSIS — Z30433 Encounter for removal and reinsertion of intrauterine contraceptive device: Secondary | ICD-10-CM | POA: Diagnosis not present

## 2018-08-17 DIAGNOSIS — H04552 Acquired stenosis of left nasolacrimal duct: Secondary | ICD-10-CM | POA: Diagnosis not present

## 2018-12-01 ENCOUNTER — Other Ambulatory Visit: Payer: Self-pay | Admitting: Family Medicine

## 2018-12-01 ENCOUNTER — Other Ambulatory Visit: Payer: Self-pay | Admitting: Family

## 2018-12-01 DIAGNOSIS — G90A Postural orthostatic tachycardia syndrome (POTS): Secondary | ICD-10-CM

## 2018-12-02 MED ORDER — TRETINOIN 0.01 % EX GEL: Freq: Every day | CUTANEOUS | 0 refills | Status: DC
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
## 2019-02-28 ENCOUNTER — Telehealth (INDEPENDENT_AMBULATORY_CARE_PROVIDER_SITE_OTHER): Payer: 59 | Admitting: Family Medicine

## 2019-02-28 DIAGNOSIS — F419 Anxiety disorder, unspecified: Secondary | ICD-10-CM

## 2019-02-28 DIAGNOSIS — G901 Familial dysautonomia [Riley-Day]: Secondary | ICD-10-CM | POA: Diagnosis not present

## 2019-02-28 DIAGNOSIS — F411 Generalized anxiety disorder: Secondary | ICD-10-CM | POA: Diagnosis not present

## 2019-02-28 DIAGNOSIS — F5102 Adjustment insomnia: Secondary | ICD-10-CM

## 2019-02-28 DIAGNOSIS — Z8669 Personal history of other diseases of the nervous system and sense organs: Secondary | ICD-10-CM

## 2019-02-28 DIAGNOSIS — F418 Other specified anxiety disorders: Secondary | ICD-10-CM

## 2019-02-28 MED ORDER — QUETIAPINE FUMARATE 50 MG PO TABS: 50.0000 mg | ORAL_TABLET | Freq: Every day | ORAL | 3 refills | Status: DC

## 2019-02-28 MED ORDER — CLONAZEPAM 1 MG PO TABS: 1.0000 mg | ORAL_TABLET | Freq: Every day | ORAL | 3 refills | Status: DC | PRN

## 2019-02-28 MED ORDER — SUMAVEL DOSEPRO 6 MG/0.5ML ~~LOC~~ SOTJ: 6.0000 mg | SUBCUTANEOUS | 2 refills | Status: DC | PRN

## 2019-02-28 MED ORDER — TERBINAFINE HCL 250 MG PO TABS
250.0000 mg | ORAL_TABLET | Freq: Every day | ORAL | 4 refills | Status: DC
Start: 2019-02-28 — End: 2019-08-31

## 2019-02-28 MED ORDER — TRETINOIN 0.01 % EX GEL: Freq: Every day | CUTANEOUS | 3 refills | Status: DC

## 2019-02-28 MED ORDER — BUTALBITAL-APAP-CAFF-COD 50-325-40-30 MG PO CAPS: 1.0000 | ORAL_CAPSULE | ORAL | 5 refills | Status: DC | PRN

## 2019-02-28 NOTE — Assessment & Plan Note (Signed)
>  40 minutes spent in face to face time with patient, >50% spent in counselling or coordination of care.  Very complicated patient.  We talked at length about getting her anxiety, depression and insomnia under control.  She is seeing two therapists. She denies SI or HI but her screenings are tremendously high.  Since she has tolerated seroquel, we agreed to a trial of seroquel 50 mg ER at bedtime to hopefully help with anxiety,depression and insomnia. She will update me in 2-3 weeks. The patient indicates understanding of these issues and agrees with the plan.

## 2019-03-22 ENCOUNTER — Encounter: Payer: Self-pay | Admitting: Family Medicine

## 2019-03-27 NOTE — Progress Notes (Signed)
Virtual Visit via Video   Due to the COVID-19 pandemic, this visit was completed with telemedicine (audio/video) technology to reduce patient and provider exposure as well as to preserve personal protective equipment.   I connected with Holly Wolf by a video enabled telemedicine application and verified that I am speaking with the correct person using two identifiers. Location patient: Home Location provider: [facility], Office Persons participating in the virtual visit: Holly Wolf, Arnette Norris, MD   I discussed the limitations of evaluation and management by telemedicine and the availability of in person appointments. The patient expressed understanding and agreed to proceed.  Care Team   Patient Care Team: Lucille Passy, MD as PCP - General (Family Medicine)  Subjective:   HPI:   Pt sent the following message and advised her to schedule an appointment to discuss further:   "My Drs at the [facility] agreed and took me off tizanadine, amitriptyline, and eszopiclone. They put me on Belbuca (300 mcg film every 12 hrs) and are having me take cyclobenzaprine (10mg ) 3x daily. I have been taking the klonopin daily and the seroquel nightly. Sleep has improved. I can fall asleep within a few hours and am not having night terrors. I still wake through the night and do not feel rested, though it is better than it was. The hallucinations and delusions have tapered off as the weeks have past. I feel like the anxiety is improving as far as number of extreme events go, but I am still having panic/anxiety attacks daily. Depression is still a daily struggle.   I'm checking in now since I do not feel like I responded poorly to the seroquel. I wouldn't mind seeing if I would benefit from a higher dose. If I need to make an appointment to touch base again, please let me know.  ~Holly Wolf"   She is not having night terrors or hallucinations after she stopped the amitriptilyne  and tizanidine.  Sonata did not help.  She is seeing a psychologologist is self hypnosis.  Depression screen [facility] 2/9 03/28/2019 02/28/2019  Decreased Interest 2 3  Down, Depressed, Hopeless 3 3  PHQ - 2 Score 5 6  Altered sleeping 1 3  Tired, decreased energy 3 3  Change in appetite 3 3  Feeling bad or failure about yourself  3 3  Trouble concentrating 1 3  Moving slowly or fidgety/restless 2 3  Suicidal thoughts 1 2  PHQ-9 Score 19 26  Difficult doing work/chores Extremely dIfficult Extremely dIfficult   GAD 7 : Generalized Anxiety Score 03/28/2019 02/28/2019  Nervous, Anxious, on Edge 3 3  Control/stop worrying 2 3  Worry too much - different things 3 3  Trouble relaxing 2 3  Restless 1 3  Easily annoyed or irritable 2 3  Afraid - awful might happen 1 3  Total GAD 7 Score 14 21  Anxiety Difficulty Somewhat difficult Extremely difficult    Seroquel has helped.  She says she would be better off dead at times but has never had a plan and her kids would be the thing that stopped her from doing it.   She is seeing a therapist regularly.    Review of Systems  Constitutional: Negative.   HENT: Negative.   Eyes: Negative.   Respiratory: Negative.   Cardiovascular: Negative.   Gastrointestinal: Negative.   Genitourinary: Negative.   Musculoskeletal: Positive for myalgias.  Skin: Negative.   Psychiatric/Behavioral: Positive for depression. Negative for hallucinations, memory loss, substance abuse  and suicidal ideas. The patient is nervous/anxious and has insomnia.      Patient Active Problem List   Diagnosis Date Noted  . Insomnia due to stress 02/28/2019  . Anxiety   . CRPS (complex regional pain syndrome), type I, upper 05/12/2017  . GAD (generalized anxiety disorder) 05/11/2017  . Burn 07/03/2015  . Post herpetic neuralgia 11/14/2013  . POTS (postural orthostatic tachycardia syndrome) 11/14/2013  . History of migraine headaches 10/26/2013  . Depression with anxiety  10/26/2013  . Dysautonomia ([place]) 10/26/2013    Social History   Tobacco Use  . Smoking status: Never Smoker  . Smokeless tobacco: Never Used  Substance Use Topics  . Alcohol use: Yes    Current Outpatient Medications:  .  bisoprolol (ZEBETA) 5 MG tablet, Take 0.5 tablets (2.5 mg total) by mouth daily. (Patient taking differently: Take 5 mg by mouth 2 (two) times daily. ), Disp: , Rfl:  .  butalbital-acetaminophen-caffeine (FIORICET WITH CODEINE) 50-325-40-30 MG capsule, Take 1 capsule by mouth every 4 (four) hours as needed for headache., Disp: 60 capsule, Rfl: 5 .  clonazePAM (KLONOPIN) 1 MG tablet, Take 1 tablet (1 mg total) by mouth daily as needed for anxiety., Disp: 30 tablet, Rfl: 3 .  HYDROcodone-acetaminophen (NORCO/VICODIN) 5-325 MG tablet, Take 1 tablet by mouth every 6 (six) hours as needed for moderate pain. (Patient not taking: Reported on 02/28/2019), Disp: 30 tablet, Rfl: 0 .  ketorolac (TORADOL) 10 MG tablet, Take 10 mg by mouth as needed (up to twice a month). , Disp: , Rfl:  .  midodrine (PROAMATINE) 2.5 MG tablet, Take 2.5 mg by mouth 3 (three) times daily as needed., Disp: , Rfl:  .  ondansetron (ZOFRAN ODT) 4 MG disintegrating tablet, Take 1 tablet (4 mg total) by mouth every 8 (eight) hours as needed for nausea or vomiting., Disp: 20 tablet, Rfl: 0 .  propranolol (INDERAL) 10 MG tablet, Take 10 mg by mouth. Take 1/4 to 1/2 tab daily prn for hear rate spikes above 110, Disp: , Rfl:  .  QUEtiapine (SEROQUEL) 100 MG tablet, Take 1 tablet (100 mg total) by mouth at bedtime., Disp: 30 tablet, Rfl: 6 .  SUMAtriptan (IMITREX) 100 MG tablet, Take 100 mg by mouth every 2 (two) hours as needed for migraine or headache. May repeat in 2 hours if headache persists or recurs., Disp: , Rfl:  .  SUMAtriptan Succinate 6 MG/0.5ML SOTJ, Inject 0.5 mg ( 6 mg ) into skin as needed use no more than two days per week., Disp: 15 mL, Rfl: 3 .  terbinafine (LAMISIL) 250 MG tablet, Take 1 tablet  (250 mg total) by mouth daily., Disp: 30 tablet, Rfl: 4 .  tretinoin (RETIN-A) 0.01 % gel, Apply topically at bedtime., Disp: 45 g, Rfl: 3  No Known Allergies  Objective:   VITALS: Per patient if applicable, see vitals. GENERAL: Alert, appears well and in no acute distress. HEENT: Atraumatic, conjunctiva clear, no obvious abnormalities on inspection of external nose and ears. NECK: Normal movements of the head and neck. CARDIOPULMONARY: No increased WOB. Speaking in clear sentences. I:E ratio WNL.  MS: Moves all visible extremities without noticeable abnormality. PSYCH: Pleasant and cooperative, well-groomed. Speech normal rate and rhythm. Affect is appropriate. Insight and judgement are appropriate. Attention is focused, linear, and appropriate.  NEURO: CN grossly intact. Oriented as arrived to appointment on time with no prompting. Moves both UE equally.  SKIN: No obvious lesions, wounds, erythema, or cyanosis noted on face or  hands.  Depression screen [facility] 2/9 03/28/2019 02/28/2019  Decreased Interest 2 3  Down, Depressed, Hopeless 3 3  PHQ - 2 Score 5 6  Altered sleeping 1 3  Tired, decreased energy 3 3  Change in appetite 3 3  Feeling bad or failure about yourself  3 3  Trouble concentrating 1 3  Moving slowly or fidgety/restless 2 3  Suicidal thoughts 1 2  PHQ-9 Score 19 26  Difficult doing work/chores Extremely dIfficult Extremely dIfficult    Assessment and Plan:   Holly Wolf was seen today for follow-up.  Diagnoses and all orders for this visit:  Dysautonomia [facility])  Depression with anxiety  Complex regional pain syndrome type 1 of left upper extremity  Insomnia due to stress  Other orders -     QUEtiapine (SEROQUEL) 100 MG tablet; Take 1 tablet (100 mg total) by mouth at bedtime. -     SUMAtriptan Succinate 6 MG/0.5ML SOTJ; Inject 0.5 mg ( 6 mg ) into skin as needed use no more than two days per week. -     tretinoin (RETIN-A) 0.01 % gel; Apply topically at bedtime.     Marland Kitchen COVID-19 Education: The signs and symptoms of COVID-19 were discussed with the patient and how to seek care for testing if needed. The importance of social distancing was discussed today. . Reviewed expectations re: course of current medical issues. . Discussed self-management of symptoms. . Outlined signs and symptoms indicating need for more acute intervention. . Patient verbalized understanding and all questions were answered. Marland Kitchen Health Maintenance issues including appropriate healthy diet, exercise, and smoking avoidance were discussed with patient. . See orders for this visit as documented in the electronic medical record.  Arnette Norris, MD  Records requested if needed. Time spent: 40 minutes, of which >50% was spent in obtaining information about her symptoms, reviewing her previous labs, evaluations, and treatments, counseling her about her condition (please see the discussed topics above), and developing a plan to further investigate it; she had a number of questions which I addressed.

## 2019-03-28 ENCOUNTER — Telehealth (INDEPENDENT_AMBULATORY_CARE_PROVIDER_SITE_OTHER): Payer: 59 | Admitting: Family Medicine

## 2019-03-28 ENCOUNTER — Encounter: Payer: Self-pay | Admitting: Family Medicine

## 2019-03-28 ENCOUNTER — Other Ambulatory Visit: Payer: Self-pay

## 2019-03-28 DIAGNOSIS — G901 Familial dysautonomia [Riley-Day]: Secondary | ICD-10-CM | POA: Diagnosis not present

## 2019-03-28 DIAGNOSIS — F418 Other specified anxiety disorders: Secondary | ICD-10-CM | POA: Diagnosis not present

## 2019-03-28 DIAGNOSIS — F5102 Adjustment insomnia: Secondary | ICD-10-CM | POA: Diagnosis not present

## 2019-03-28 DIAGNOSIS — G90512 Complex regional pain syndrome I of left upper limb: Secondary | ICD-10-CM | POA: Diagnosis not present

## 2019-03-28 DIAGNOSIS — F4312 Post-traumatic stress disorder, chronic: Secondary | ICD-10-CM | POA: Insufficient documentation

## 2019-03-28 MED ORDER — QUETIAPINE FUMARATE 100 MG PO TABS: 100.0000 mg | ORAL_TABLET | Freq: Every day | ORAL | 6 refills | Status: DC

## 2019-03-28 MED ORDER — SUMATRIPTAN SUCCINATE 6 MG/0.5ML ~~LOC~~ SOTJ
SUBCUTANEOUS | 3 refills | Status: AC
Start: 2019-03-28 — End: ?

## 2019-03-28 MED ORDER — TRETINOIN 0.01 % EX GEL: Freq: Every day | CUTANEOUS | 3 refills | Status: DC

## 2019-03-28 NOTE — Assessment & Plan Note (Signed)
>  40  minutes spent in face to face time with patient, >50% spent in counselling or coordination of care discussing PTSD, anxiety, depression.  Her anxiety and depression screens have improved somewhat with additional of seroquel which is great. We increased dose to 100 mg qhs.  She will update me. I also asked the she talk to her therapist about EMDR.   She will keep me updated. The patient indicates understanding of these issues and agrees with the plan.

## 2019-06-22 ENCOUNTER — Encounter: Payer: Self-pay | Admitting: Family Medicine

## 2019-07-27 ENCOUNTER — Encounter: Payer: Self-pay | Admitting: Family Medicine

## 2019-07-27 ENCOUNTER — Ambulatory Visit (INDEPENDENT_AMBULATORY_CARE_PROVIDER_SITE_OTHER): Payer: 59 | Admitting: Family Medicine

## 2019-07-27 DIAGNOSIS — F418 Other specified anxiety disorders: Secondary | ICD-10-CM | POA: Diagnosis not present

## 2019-07-27 DIAGNOSIS — G901 Familial dysautonomia [Riley-Day]: Secondary | ICD-10-CM

## 2019-07-27 DIAGNOSIS — G90512 Complex regional pain syndrome I of left upper limb: Secondary | ICD-10-CM | POA: Diagnosis not present

## 2019-07-27 DIAGNOSIS — F411 Generalized anxiety disorder: Secondary | ICD-10-CM

## 2019-07-27 DIAGNOSIS — Z8669 Personal history of other diseases of the nervous system and sense organs: Secondary | ICD-10-CM

## 2019-07-27 MED ORDER — BUTALBITAL-APAP-CAFF-COD 50-325-40-30 MG PO CAPS: 1.0000 | ORAL_CAPSULE | ORAL | 5 refills | Status: DC | PRN

## 2019-07-27 MED ORDER — CLONAZEPAM 1 MG PO TABS: 1.0000 mg | ORAL_TABLET | Freq: Every day | ORAL | 3 refills | Status: DC | PRN

## 2019-07-27 MED ORDER — QUETIAPINE FUMARATE 100 MG PO TABS: 100.0000 mg | ORAL_TABLET | Freq: Every day | ORAL | 6 refills | Status: DC

## 2019-07-27 NOTE — Progress Notes (Signed)
TELEPHONE ENCOUNTER   Patient verbally agreed to telephone visit and is aware that copayment and coinsurance may apply. Patient was treated using telemedicine according to accepted telemedicine protocols.  Location of the patient: patient's home  Location of provider: provider's home Names of all persons participating in the telemedicine service and role in the encounter: Arnette Norris, MD [place]  Subjective:   Chief Complaint  Patient presents with  . Follow-up     HPI    BP Readings from Last 3 Encounters:  02/25/18 122/82  12/30/17 (!) 108/50  05/11/17 136/76    Patient Active Problem List   Diagnosis Date Noted  . Chronic post-traumatic stress disorder (PTSD) 03/28/2019  . Insomnia due to stress 02/28/2019  . Anxiety   . CRPS (complex regional pain syndrome), type I, upper 05/12/2017  . GAD (generalized anxiety disorder) 05/11/2017  . Burn 07/03/2015  . Post herpetic neuralgia 11/14/2013  . POTS (postural orthostatic tachycardia syndrome) 11/14/2013  . History of migraine headaches 10/26/2013  . Depression with anxiety 10/26/2013  . Dysautonomia ([place]) 10/26/2013   Social History   Tobacco Use  . Smoking status: Never Smoker  . Smokeless tobacco: Never Used  Substance Use Topics  . Alcohol use: Yes    Current Outpatient Medications:  .  bisoprolol (ZEBETA) 5 MG tablet, Take 0.5 tablets (2.5 mg total) by mouth daily. (Patient taking differently: Take 5 mg by mouth 2 (two) times daily. ), Disp: , Rfl:  .  butalbital-acetaminophen-caffeine (FIORICET WITH CODEINE) 50-325-40-30 MG capsule, Take 1 capsule by mouth every 4 (four) hours as needed for headache., Disp: 60 capsule, Rfl: 5 .  clonazePAM (KLONOPIN) 1 MG tablet, Take 1 tablet (1 mg total) by mouth daily as needed for anxiety., Disp: 30 tablet, Rfl: 3 .  ketorolac (TORADOL) 10 MG tablet, Take 10 mg by mouth as needed (up to twice a month). , Disp: , Rfl:  .  midodrine (PROAMATINE) 2.5 MG tablet,  Take 2.5 mg by mouth 3 (three) times daily as needed., Disp: , Rfl:  .  ondansetron (ZOFRAN ODT) 4 MG disintegrating tablet, Take 1 tablet (4 mg total) by mouth every 8 (eight) hours as needed for nausea or vomiting., Disp: 20 tablet, Rfl: 0 .  propranolol (INDERAL) 10 MG tablet, Take 10 mg by mouth. Take 1/4 to 1/2 tab daily prn for hear rate spikes above 110, Disp: , Rfl:  .  QUEtiapine (SEROQUEL) 100 MG tablet, Take 1 tablet (100 mg total) by mouth at bedtime., Disp: 30 tablet, Rfl: 6 .  SUMAtriptan (IMITREX) 100 MG tablet, Take 100 mg by mouth every 2 (two) hours as needed for migraine or headache. May repeat in 2 hours if headache persists or recurs., Disp: , Rfl:  .  SUMAtriptan Succinate 6 MG/0.5ML SOTJ, Inject 0.5 mg ( 6 mg ) into skin as needed use no more than two days per week., Disp: 15 mL, Rfl: 3 .  terbinafine (LAMISIL) 250 MG tablet, Take 1 tablet (250 mg total) by mouth daily., Disp: 30 tablet, Rfl: 4 .  tretinoin (RETIN-A) 0.01 % gel, Apply topically at bedtime., Disp: 45 g, Rfl: 3 No Known Allergies  Assessment & Plan:   1. POTS (postural orthostatic tachycardia syndrome)   2. Dysautonomia ([place])   3. 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 GAD (generalized anxiety disorder)   5. History of migraine headaches   6. Depression with anxiety     No orders of  the defined types were placed in this encounter.  Meds ordered this encounter  Medications  . clonazePAM (KLONOPIN) 1 MG tablet    Sig: Take 1 tablet (1 mg total) by mouth daily as needed for anxiety.    Dispense:  30 tablet    Refill:  3  . butalbital-acetaminophen-caffeine (FIORICET WITH CODEINE) 50-325-40-30 MG capsule    Sig: Take 1 capsule by mouth every 4 (four) hours as needed for headache.    Dispense:  60 capsule    Refill:  5  . QUEtiapine (SEROQUEL) 100 MG tablet    Sig: Take 1 tablet (100 mg total) by mouth at bedtime.    Dispense:  30 tablet    Refill:  6    Arnette Norris,  MD 07/27/2019  Time spent with the patient: 30 minutes, spent in obtaining information about her symptoms, reviewing her previous labs, evaluations, and treatments, counseling her about her condition (please see the discussed topics above), and developing a plan to further investigate it; she had a number of questions which I addressed.   [id number] physician/qualified health professional telephone evaluation 5 to 10 minutes [id number] physician/qualified help functional [place] evaluation for 11 to 20 minutes [id number] physician/qualify he will professional telephone evaluation for 21 to 30 minutes .   Lab Results  Component Value Date   WBC 4.8 04/13/2014   HGB 13.2 04/13/2014   HCT 37.5 04/13/2014   PLT 208 04/13/2014   GLUCOSE 87 12/30/2017   CHOL 126 11/14/2013   TRIG 55.0 11/14/2013   HDL 58.40 11/14/2013   LDLCALC 57 11/14/2013   ALT 11 12/30/2017   AST 15 12/30/2017   NA 137 12/30/2017   K 4.6 12/30/2017   CL 102 12/30/2017   CREATININE 0.82 12/30/2017   BUN 9 12/30/2017   CO2 29 12/30/2017   TSH 0.87 12/30/2017    Lab Results  Component Value Date   TSH 0.87 12/30/2017   Lab Results  Component Value Date   WBC 4.8 04/13/2014   HGB 13.2 04/13/2014   HCT 37.5 04/13/2014   MCV 85.0 04/13/2014   PLT 208 04/13/2014   Lab Results  Component Value Date   NA 137 12/30/2017   K 4.6 12/30/2017   CO2 29 12/30/2017   GLUCOSE 87 12/30/2017   BUN 9 12/30/2017   CREATININE 0.82 12/30/2017   BILITOT 0.5 12/30/2017   ALKPHOS 33 (L) 12/30/2017   AST 15 12/30/2017   ALT 11 12/30/2017   PROT 7.3 12/30/2017   ALBUMIN 4.7 12/30/2017   CALCIUM 9.8 12/30/2017   GFR 82.86 12/30/2017   Lab Results  Component Value Date   CHOL 126 11/14/2013   Lab Results  Component Value Date   HDL 58.40 11/14/2013   Lab Results  Component Value Date   LDLCALC 57 11/14/2013   Lab Results  Component Value Date   TRIG 55.0 11/14/2013   Lab Results  Component Value Date   CHOLHDL 2  11/14/2013   No results found for: HGBA1C     Assessment & Plan:   Problem List Items Addressed This Visit      Active Problems   History of migraine headaches   Relevant Medications   butalbital-acetaminophen-caffeine (FIORICET WITH CODEINE) 50-325-40-30 MG capsule   Depression with anxiety    History: Current symptoms include anhedonia, depressed mood, difficulty concentrating, fatigue and feelings of worthlessness/guilt. Symptoms have been gradually worsening since that time. Patient denies recurrent thoughts of death, suicidal attempt, suicidal thoughts with specific plan  and suicidal thoughts without plan. Previous treatment includes: individual therapy and Similar to EMDR, biofeedback. She complains of the following side effects from the treatment: none. Assessment/Plan:      1. Medications: unchanged. 2. Has a regular therapist. 3. Instructed patient to contact office or on-call physician promptly should condition worsen or any new symptoms appear. IF THE PATIENT HAS ANY SUICIDAL OR HOMICIDAL IDEATIONS, CALL THE OFFICE, DISCUSS WITH A SUPPORT MEMBER, OR GO TO THE ER IMMEDIATELY. Patient was agreeable with this plan.       Dysautonomia (HCC)   POTS (postural orthostatic tachycardia syndrome) - Primary   GAD (generalized anxiety disorder)   Relevant Medications   clonazePAM (KLONOPIN) 1 MG tablet   CRPS (complex regional pain syndrome), type I, upper    Sees Dr. Mechele Dawley- [facility], Dr. Mechele Dawley. Was last seen on 07/20/19.  Assessment / Plan:  ICD-10-CM  1. 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 Chronic pain of left upper extremity M79.602  G89.29  4. Gastroparesis K31.84  5. Myofascial pain M79.18  6. Back pain, unspecified back location, unspecified back pain laterality, unspecified chronicity M54.9  7. Pain syndrome, chronic G89.4  8. Buttock pain M79.18  9. Non-specific  low back pain M54.5   PLAN  ? Discontinue Levorphanol. Pills counted # 12 of # 180 counted logged and destroyed ? Start Methadone 5 mg TID # 90 DNF 12/31 sent to [facility] ? EKG done at today's visit QT 317. Reviewed by Dr. Mechele Dawley and by myself ? Continue Flexeril 10 mg TID prn Refilled ? Continue CBD: Patient requires # 2 1500 mg fulspectra cbd and # 2 4 ounce 1200 mg coconut salve given to patient to turn in to her [facility] for approval ? Continue CBT with Dr. Hardin Negus then patient has not been referred to a psychologist/psychiatrist for EMDR and mental health evaluation as she reports that Dr. Hardin Negus is managing her mental health. ? Patient reports that she is continuing Seroquel 100 mg qhs and that it is working well per her PCP ? Patient is s/p 07/10/2019 IV Ketamine infusion with 25 % change in quality as patient reports she is not having as much shooting pain but reports that the severity of pain remains the same. We will order a series of IV ketamine infusions to be given once per week over a 4 week period. This will be ordered today. ? Patient is s/p 07/06/2019 Right low back field block of painful SCS battery siteCluneal Neuralgia due to painful SCS battery site and CRPS with 75-100 % pain relief noted.  ? Order: IV Ketamine infusion. We will order a series of IV ketamine infusions to be given once per week over a 4 week period. ? Consider pending approval o IV Padrionate Bisphosphate Treatment for CRPS study given to patient and to Case Manager at last visit ? Continue with SCS as beneficial. [employer] representative Leigh notified for reprogramming for better UE pain coverage ? Beth Hagler RN, CCM in with patient on her visit ? RTC in 4 weeks or sooner if needed.    PLAN: Treatment plan: The treatment plan was discussed with the patient along with alternatives to medication usage. Education was also given to the patient in regards to the importance of exercise and activity, safe use of any/all  controlled substances, safe medication use and storage, keeping medications out of the reach of children, common side effects, adverse  reactions, opioid overdose symptoms along with understanding of Naloxone usage and importance of calling for help in the event of an emergency. Patient understands all directives given and understands the importance of compliance with medical directives. Patient agrees to follow up as necessary for medical management, worsening symptoms, or any change in condition  Benefits versus Risks: Benefits versus Risks for all medications prescribed, treatment plan, medications prescribed today were thoroughly discussed with patient with verbal acknowledgement of understanding after full counseling in regards to that benefits outweigh risks of all treatment (see patient signed contract). Patient has been counseled and agrees to follow the stipulations for medical compliance with consent for controlled substance protocol agreement and understands that all medical management directives are to be followed including follow-up and management care. Treatment Goals: Treatments goals are to improve quality of life and increase activities of daily living. Treat underlying pathology, enhance ability to manage pain independently, improve function and sustain quality of life, limit use of pain medication and improve psychological function.  Prescribed Medications: Medications that are prescribed allows patient to have increased quality of life and to be able to perform activities of daily living. Goal of treatment is to reduce pain by 20 - 40 % and increase activity level. Recommendations for alternative therapies and life style changes discussed. No adverse reactions reported with medication. The patient was instructed to call with questions or concerns. Patient to continue with at home physical therapy exercises, weight loss if indicated, healthy diet, exercise, home exercise, heat/cold, massage,  continue alternate ice and heat, and continue with exercises/stretches. Goal is for patient to be able to perform activities of daily living relatively pain free be mobile and be able to be a functional member of society, able to perform their normal daily routines, shopping, chores, employment if able, family activities and hobbies that they enjoy. Patient reports that without the medication their quality of life is greatly diminished and they are unable to be functional and unable to do their normal activities, exercise, walk, or any work or chores at home.  Weight Management: Diet and exercise, decrease COH, animal fats, fried and fast foods, eat leaner meats such as [place], chicken, fish baked or broiled, avoid foods that are processed or fast foods. Exercise 2 - 3 times weekly. Avoid sodas or diet drinks. Drink water. Eat vegetables and fruits that are in season. Limit portion sizes. Eat smaller more frequent meals. keep food diary. Conservative Treatments: Conservative treatments such as Rest, Ice, Heat, Exercise, Bio freeze, Home Physical Therapy Exercises, Strengthening Exercises, Stretching Exercises, Massage, Tai Chi, Acupuncture, Cognitive Behavioral Techniques, Relaxation Techniques, Yoga, Meditation, Water Aerobics  Alternative Treatments:  Acupuncture: Theories differ on how long acupuncture has been around, but this ancient [place] healing art has been in use for at least 2,000 years. The practice involves inserting hair-thin needles into various points on the skin in an attempt to regulate movement within the body's meridian system. Numerous studies have confirmed acupuncture's effectiveness in treating pain, and the World Health Organization includes pain on its list of conditions for which acupuncture is beneficial.  Aromatherapy: This pain management therapy uses scents from essential plant oils that are either applied to the skin or inhaled. Aromatherapy for health purposes dates back  thousands of years, playing an important role in the medical systems of the [place], Indians, Egyptians, Romans, and Greeks. Today it is one of the alternative treatments used for a wide variety of conditions, including pain. Studies have shown a decrease in pain symptoms  in people with rheumatoid arthritis, headaches, and cancer who use aromatherapy.  Biofeedback: Biofeedback is a pain management technique that teaches the patient to consciously affect normally involuntary bodily functions, such a heart rate, muscle tension, and blood pressure. The idea is that by becoming aware of these functions, and specifically how they correlate to pain, you can adjust them in accordance with pain symptoms. Research is inconclusive as to why biofeedback helps decrease pain, but one theory is that it simply helps the patient relax, which in turn reduces physical symptoms that can be worsened by stress. Electromyography, or EMG measures muscle tension and is the type of biofeedback that has been shown to be most helpful in pain management.  Chiropractic: Although it's now a mainstream option, chiropractic is still technically considered a form of complementary and alternative medicine. Chiropractors look at the relationship between the structure and function of the body in order to decrease pain. The main focus is the spine, and most chiropractic visits involve adjustments that are designed to realign the body to promote self-healing. Chiropractic has been shown to be effective for a variety of pain syndromes, including lower back pain, neck pain, and carpal tunnel, headaches, and sports injuries.  Hypnotherapy: This complementary and alternative approach is used to promote relaxation and induce an altered state of consciousness. The resulting psychological shift is intended to help people gain control over their states of awareness, which theoretically can help them gain control over their physical body, including their  pain symptoms. Research suggests that hypnosis can help reduce the need for pain medication by decreasing the anxiety that's typically associated with pain.  Massage: Massage feels great, but it also is a time-tested healing method for various kinds of pain. Through manipulation of the body's soft tissues, massage therapy influences the muscles, circulation, and lymphatic and nervous systems. Several clinical studies have shown massage to be an effective pain management therapy. A recent review of multiple trials concluded that massage is beneficial for chronic lower back pain.  Relaxation Therapy: Relaxation therapy as it has been well documented show that stress can worsen, or even cause, disease -- and pain is no exception. When we're stressed, our bodies go into fight-or-flight mode, which causes an increase in blood pressure and heart rate and a tensing of muscles. Over time. This can wreak havoc on our internal organs and musculoskeletal systems. A variety of relaxation techniques, including guided imagery. Progressive muscle relaxation, and meditation, are used in the medical settings to counteract these negative effects of stress. Studies have shown that patients who undergo relaxation therapy can experience a reduction in pain symptoms.  Compliance: There has been no evidence of narcotic diversion or any evidence of violating the Narcotic Use Agreement. Misuse of medications are denied and the patient does not have any impairment, loss of function or sedation reported with the use of any of their medication. The patient also denies any side effects related to the use of their narcotic medications. Patient denies feeling addicted to opioids. Patient denies giving away the prescription opioids. Patient denies selling the prescription opioids. Patient denies using the analgesics for anything other than for their pain relief. The last UDS was reviewed and is appropriate and the pill count was appropriate.  Patient is compliant with medical medication regimen as prescribed. The patient agrees to verification of compliance with the medication management program through pill counts and urine drug screening per protocol (see patient signed contract). Naloxone kit prescribed and patient understands how to use  Naloxone kit by demonstration and verbalizes instruction of use back to provider. Patient understands the importance securely placing medications and to keep out of the reach of children.  Respiratory Issues: If patient has been diagnosed with any type of respiratory condition they are to follow their prescribed medical directives. If patient has sleep apnea they understand that they are to sleep with their CPAP or BIPAP every night. If patient uses oxygen at night, patient is to use their oxygen every night. Patient, if prescribed any other controlled substances will take these medications at least 2 hours apart as combinations of any controlled substances can do harm, have adverse side effects that could result in harm to the patient, and the possibility of death. Patient verbalizes and repeated the risks back to the provider of the understanding of taking any/all controlled substances but continues to affirm that the benefits to themselves outweigh all risks associated with the taking of medications. All patients' questions were answered. The patient was fully counseled in its entirety.  Side Effects of Medication: Patient understands to report any side effects of medications prescribed and to discontinue any medication that is causing side effects. Patient has an active Naloxone Kit in the event of adverse side effect and understands to call 911 for emergency help.  Follow Up: Patient is return as scheduled for follow up for pain management and patient further understands to continue to see their primary care provider for all other health care needs. Patient is to return or call [place] if  any symptoms change, worsen or persist.  Urine Drug Testing: Patient has an active contract in place Doctor, hospital). Patient understands that a urine drug screening test will be done and results reviewed by provider per protocol. Patient has urine drug screening with toxicology results within parameters for policy with [facility]. A urine drug screen was reviewed for this visit per protocol.  Questionaaires/Assessments'Screenings have been reviewed: Pain Management Risk - - [place], Comm, Opioid Risk Tool, Pain Rating Scale.  Opioid risk tool assessment taken 07/29/2018 demonstrates a score of 2. Patient is considered low risk for aberrant behaviors.  SOAPP=0 COMM=0   Controlled Substance Monitoring The PMP aware online was queried to confirm compliance regarding the patient's narcotic pain medications. My review reveals appropriate prescription fills and that [facility] is the sole provider of these medications. Rechecks will occur regularly and the patient is aware of our use of the system.  Having taken into account the patient's medical, social, and psychiatric history, along with the patient's demographic characteristics, history of compliance and pain diagnoses, I consider this patient to be moderate risk for chronic opiate therapy. Urine Drug Screening is a widely available and familiar form of testing used in order to make informed choices with regard to clinical decision making in chronic pain management patients. Opioid analgesics must be prescribed with discernment and their appropriate use must be assessed periodically. Urine Drug Screening can help track patient compliance, identify substance abuse and misuse, and prevent potentially dangerous drug-drug interactions. Today, an 8-panel point of care immunoassay and alcohol screen was performed in order to accomplish this and to help determine the best course of action for today's visit. Immunoassay  urine drug screening, like all tests, is not without its limitations with regard to specificity and sensitivity. For example, 1) We are unable to detect fentanyl with our currently available immunoassay technology. 2) We are unable to determine the specific medications present and their metabolites within the drug  classes. 3) The Opiate class represents morphine, hydrocodone, and hydromorphone. Differentiation between these cannot be determined. 4) Likewise, the Methadone class represents methadone and tapentadol. 5) The Oxycodone class represents oxycodone and oxymorphone. 6) The Benzodiazepine class contains all commonly prescribed in that category. Due to these and other limitations encountered with this form of screening, confirmatory testing may also be deemed to be medically necessary and ordered to assist in making decisions regarding this patient's long-term care. Identification of specific medications via LCMS testing, something that is not available on the Point of Care Immunoassay screen, is medically necessary in order to provide appropriate care and vigilant compliance monitoring of the use of these potentially dangerous medications.  Opioid agreement signed.  03/03/2019 UDS confirmatory reviewed and appropriate  Opioid risk tool assessment taken 07/29/2018 demonstrates a score of 2. Patient is considered low risk for aberrant behaviors.  Narcan confirmed received. With the risk of opioid medication usage and overdose, the patient was educated on the risks and potential side effects of opioid medication. Patient has been given naloxone in the event of side effects or adverse reactions or overdose. Patient has demonstrated the proper use along with verbal directions. They have been instructed on how to use naloxone and to call 911 for emergency help in the event of adverse reactions or overdose.   Risks and benefits of the above medication(s) were explained. These include, but are not limited to,  respiratory depression, sedation, death, and increased risk of suicide. If applicable, symptoms of serotonin syndrome were explained to the patient as well. The patient expresses understanding of this. The patient claims life is worth living and denies self-harming or suicidal ideations. Patient is advised to contact this clinic with questions/concerns and to report to the Emergency Room or call 911 if any concerning symptoms develop.  I counseled the patient on the danger of opioid therapy and that medications should never be taken except as prescribed. We discussed how extreme caution is to be used whenever they are taking their opioid medications especially as it relates to driving or operating machinery. We discussed that alcohol use without opioid therapy concomitantly is absolutely prohibited. Patient was told about the risk of respiratory depression especially with concomitant use of opioids with benzodiazepines and was advised to tell his/her other providers that he/she is receiving opioids from [place]. Finally, it was stated that misuse of opioids can lead to death.  Plan of care was formulated with my supervising physician  Jacqulyn Bath FNP-BC  Wylene Men MD       Relevant Medications   clonazePAM (KLONOPIN) 1 MG tablet   butalbital-acetaminophen-caffeine (FIORICET WITH CODEINE) 50-325-40-30 MG capsule   QUEtiapine (SEROQUEL) 100 MG tablet      I have discontinued Onedia Kunz-Smith's HYDROcodone-acetaminophen. I am also having her maintain her SUMAtriptan, ketorolac, bisoprolol, midodrine, propranolol, ondansetron, terbinafine, SUMAtriptan Succinate, tretinoin, clonazePAM, butalbital-acetaminophen-caffeine, and QUEtiapine.  Meds ordered this encounter  Medications  . clonazePAM (KLONOPIN) 1 MG tablet    Sig: Take 1 tablet (1 mg total) by mouth daily as needed for anxiety.    Dispense:  30 tablet    Refill:  3  . butalbital-acetaminophen-caffeine (FIORICET WITH CODEINE) 50-325-40-30 MG  capsule    Sig: Take 1 capsule by mouth every 4 (four) hours as needed for headache.    Dispense:  60 capsule    Refill:  5  . QUEtiapine (SEROQUEL) 100 MG tablet    Sig: Take 1 tablet (100 mg total) by mouth at bedtime.  Dispense:  30 tablet    Refill:  6     Arnette Norris, MD

## 2019-07-27 NOTE — Assessment & Plan Note (Signed)
Sees Dr. Mechele Dawley- [facility], Dr. Mechele Dawley. Was last seen on 07/20/19.  Assessment / Plan:  ICD-10-CM  1. 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 Chronic pain of left upper extremity M79.602  G89.29  4. Gastroparesis K31.84  5. Myofascial pain M79.18  6. Back pain, unspecified back location, unspecified back pain laterality, unspecified chronicity M54.9  7. Pain syndrome, chronic G89.4  8. Buttock pain M79.18  9. Non-specific low back pain M54.5   PLAN  ? Discontinue Levorphanol. Pills counted # 12 of # 180 counted logged and destroyed ? Start Methadone 5 mg TID # 90 DNF 12/31 sent to [facility] ? EKG done at today's visit QT 317. Reviewed by Dr. Mechele Dawley and by myself ? Continue Flexeril 10 mg TID prn Refilled ? Continue CBD: Patient requires # 2 1500 mg fulspectra cbd and # 2 4 ounce 1200 mg coconut salve given to patient to turn in to her [facility] for approval ? Continue CBT with Dr. Hardin Negus then patient has not been referred to a psychologist/psychiatrist for EMDR and mental health evaluation as she reports that Dr. Hardin Negus is managing her mental health. ? Patient reports that she is continuing Seroquel 100 mg qhs and that it is working well per her PCP ? Patient is s/p 07/10/2019 IV Ketamine infusion with 25 % change in quality as patient reports she is not having as much shooting pain but reports that the severity of pain remains the same. We will order a series of IV ketamine infusions to be given once per week over a 4 week period. This will be ordered today. ? Patient is s/p 07/06/2019 Right low back field block of painful SCS battery siteCluneal Neuralgia due to painful SCS battery site and CRPS with 75-100 % pain relief noted.  ? Order: IV Ketamine infusion. We will order a series of IV ketamine infusions to be given once per week over a 4 week period. ? Consider pending approval o IV Padrionate  Bisphosphate Treatment for CRPS study given to patient and to Case Manager at last visit ? Continue with SCS as beneficial. [employer] representative Leigh notified for reprogramming for better UE pain coverage ? Beth Hagler RN, CCM in with patient on her visit ? RTC in 4 weeks or sooner if needed.    PLAN: Treatment plan: The treatment plan was discussed with the patient along with alternatives to medication usage. Education was also given to the patient in regards to the importance of exercise and activity, safe use of any/all controlled substances, safe medication use and storage, keeping medications out of the reach of children, common side effects, adverse reactions, opioid overdose symptoms along with understanding of Naloxone usage and importance of calling for help in the event of an emergency. Patient understands all directives given and understands the importance of compliance with medical directives. Patient agrees to follow up as necessary for medical management, worsening symptoms, or any change in condition  Benefits versus Risks: Benefits versus Risks for all medications prescribed, treatment plan, medications prescribed today were thoroughly discussed with patient with verbal acknowledgement of understanding after full counseling in regards to that benefits outweigh risks of all treatment (see patient signed contract). Patient has been counseled and agrees to follow the stipulations for medical compliance with consent for controlled substance protocol agreement and understands that all medical management directives are to be followed including follow-up and management care. Treatment Goals: Treatments  goals are to improve quality of life and increase activities of daily living. Treat underlying pathology, enhance ability to manage pain independently, improve function and sustain quality of life, limit use of pain medication and improve psychological function.  Prescribed Medications:  Medications that are prescribed allows patient to have increased quality of life and to be able to perform activities of daily living. Goal of treatment is to reduce pain by 20 - 40 % and increase activity level. Recommendations for alternative therapies and life style changes discussed. No adverse reactions reported with medication. The patient was instructed to call with questions or concerns. Patient to continue with at home physical therapy exercises, weight loss if indicated, healthy diet, exercise, home exercise, heat/cold, massage, continue alternate ice and heat, and continue with exercises/stretches. Goal is for patient to be able to perform activities of daily living relatively pain free be mobile and be able to be a functional member of society, able to perform their normal daily routines, shopping, chores, employment if able, family activities and hobbies that they enjoy. Patient reports that without the medication their quality of life is greatly diminished and they are unable to be functional and unable to do their normal activities, exercise, walk, or any work or chores at home.  Weight Management: Diet and exercise, decrease COH, animal fats, fried and fast foods, eat leaner meats such as [place], chicken, fish baked or broiled, avoid foods that are processed or fast foods. Exercise 2 - 3 times weekly. Avoid sodas or diet drinks. Drink water. Eat vegetables and fruits that are in season. Limit portion sizes. Eat smaller more frequent meals. keep food diary. Conservative Treatments: Conservative treatments such as Rest, Ice, Heat, Exercise, Bio freeze, Home Physical Therapy Exercises, Strengthening Exercises, Stretching Exercises, Massage, Tai Chi, Acupuncture, Cognitive Behavioral Techniques, Relaxation Techniques, Yoga, Meditation, Water Aerobics  Alternative Treatments:  Acupuncture: Theories differ on how long acupuncture has been around, but this ancient [place] healing art has been in use  for at least 2,000 years. The practice involves inserting hair-thin needles into various points on the skin in an attempt to regulate movement within the body's meridian system. Numerous studies have confirmed acupuncture's effectiveness in treating pain, and the World Health Organization includes pain on its list of conditions for which acupuncture is beneficial.  Aromatherapy: This pain management therapy uses scents from essential plant oils that are either applied to the skin or inhaled. Aromatherapy for health purposes dates back thousands of years, playing an important role in the medical systems of the [place], Indians, Egyptians, Romans, and Greeks. Today it is one of the alternative treatments used for a wide variety of conditions, including pain. Studies have shown a decrease in pain symptoms in people with rheumatoid arthritis, headaches, and cancer who use aromatherapy.  Biofeedback: Biofeedback is a pain management technique that teaches the patient to consciously affect normally involuntary bodily functions, such a heart rate, muscle tension, and blood pressure. The idea is that by becoming aware of these functions, and specifically how they correlate to pain, you can adjust them in accordance with pain symptoms. Research is inconclusive as to why biofeedback helps decrease pain, but one theory is that it simply helps the patient relax, which in turn reduces physical symptoms that can be worsened by stress. Electromyography, or EMG measures muscle tension and is the type of biofeedback that has been shown to be most helpful in pain management.  Chiropractic: Although it's now a mainstream option, chiropractic is still technically considered a  form of complementary and alternative medicine. Chiropractors look at the relationship between the structure and function of the body in order to decrease pain. The main focus is the spine, and most chiropractic visits involve adjustments that are designed  to realign the body to promote self-healing. Chiropractic has been shown to be effective for a variety of pain syndromes, including lower back pain, neck pain, and carpal tunnel, headaches, and sports injuries.  Hypnotherapy: This complementary and alternative approach is used to promote relaxation and induce an altered state of consciousness. The resulting psychological shift is intended to help people gain control over their states of awareness, which theoretically can help them gain control over their physical body, including their pain symptoms. Research suggests that hypnosis can help reduce the need for pain medication by decreasing the anxiety that's typically associated with pain.  Massage: Massage feels great, but it also is a time-tested healing method for various kinds of pain. Through manipulation of the body's soft tissues, massage therapy influences the muscles, circulation, and lymphatic and nervous systems. Several clinical studies have shown massage to be an effective pain management therapy. A recent review of multiple trials concluded that massage is beneficial for chronic lower back pain.  Relaxation Therapy: Relaxation therapy as it has been well documented show that stress can worsen, or even cause, disease -- and pain is no exception. When we're stressed, our bodies go into fight-or-flight mode, which causes an increase in blood pressure and heart rate and a tensing of muscles. Over time. This can wreak havoc on our internal organs and musculoskeletal systems. A variety of relaxation techniques, including guided imagery. Progressive muscle relaxation, and meditation, are used in the medical settings to counteract these negative effects of stress. Studies have shown that patients who undergo relaxation therapy can experience a reduction in pain symptoms.  Compliance: There has been no evidence of narcotic diversion or any evidence of violating the Narcotic Use Agreement. Misuse of  medications are denied and the patient does not have any impairment, loss of function or sedation reported with the use of any of their medication. The patient also denies any side effects related to the use of their narcotic medications. Patient denies feeling addicted to opioids. Patient denies giving away the prescription opioids. Patient denies selling the prescription opioids. Patient denies using the analgesics for anything other than for their pain relief. The last UDS was reviewed and is appropriate and the pill count was appropriate. Patient is compliant with medical medication regimen as prescribed. The patient agrees to verification of compliance with the medication management program through pill counts and urine drug screening per protocol (see patient signed contract). Naloxone kit prescribed and patient understands how to use Naloxone kit by demonstration and verbalizes instruction of use back to provider. Patient understands the importance securely placing medications and to keep out of the reach of children.  Respiratory Issues: If patient has been diagnosed with any type of respiratory condition they are to follow their prescribed medical directives. If patient has sleep apnea they understand that they are to sleep with their CPAP or BIPAP every night. If patient uses oxygen at night, patient is to use their oxygen every night. Patient, if prescribed any other controlled substances will take these medications at least 2 hours apart as combinations of any controlled substances can do harm, have adverse side effects that could result in harm to the patient, and the possibility of death. Patient verbalizes and repeated the risks back to the provider  of the understanding of taking any/all controlled substances but continues to affirm that the benefits to themselves outweigh all risks associated with the taking of medications. All patients' questions were answered. The patient was fully counseled in  its entirety.  Side Effects of Medication: Patient understands to report any side effects of medications prescribed and to discontinue any medication that is causing side effects. Patient has an active Naloxone Kit in the event of adverse side effect and understands to call 911 for emergency help.  Follow Up: Patient is return as scheduled for follow up for pain management and patient further understands to continue to see their primary care provider for all other health care needs. Patient is to return or call [facility] if any symptoms change, worsen or persist.  Urine Drug Testing: Patient has an active contract in place Doctor, hospital). Patient understands that a urine drug screening test will be done and results reviewed by provider per protocol. Patient has urine drug screening with toxicology results within parameters for policy with [place]. A urine drug screen was reviewed for this visit per protocol.  Questionaaires/Assessments'Screenings have been reviewed: Pain Management Risk - - Soapp, Comm, Opioid Risk Tool, Pain Rating Scale.  Opioid risk tool assessment taken 07/29/2018 demonstrates a score of 2. Patient is considered low risk for aberrant behaviors.  SOAPP=0 COMM=0   Controlled Substance Monitoring The PMP aware online was queried to confirm compliance regarding the patient's narcotic pain medications. My review reveals appropriate prescription fills and that [facility] is the sole provider of these medications. Rechecks will occur regularly and the patient is aware of our use of the system.  Having taken into account the patient's medical, social, and psychiatric history, along with the patient's demographic characteristics, history of compliance and pain diagnoses, I consider this patient to be moderate risk for chronic opiate therapy. Urine Drug Screening is a widely available and familiar form of testing used in order to  make informed choices with regard to clinical decision making in chronic pain management patients. Opioid analgesics must be prescribed with discernment and their appropriate use must be assessed periodically. Urine Drug Screening can help track patient compliance, identify substance abuse and misuse, and prevent potentially dangerous drug-drug interactions. Today, an 8-panel point of care immunoassay and alcohol screen was performed in order to accomplish this and to help determine the best course of action for today's visit. Immunoassay urine drug screening, like all tests, is not without its limitations with regard to specificity and sensitivity. For example, 1) We are unable to detect fentanyl with our currently available immunoassay technology. 2) We are unable to determine the specific medications present and their metabolites within the drug classes. 3) The Opiate class represents morphine, hydrocodone, and hydromorphone. Differentiation between these cannot be determined. 4) Likewise, the Methadone class represents methadone and tapentadol. 5) The Oxycodone class represents oxycodone and oxymorphone. 6) The Benzodiazepine class contains all commonly prescribed in that category. Due to these and other limitations encountered with this form of screening, confirmatory testing may also be deemed to be medically necessary and ordered to assist in making decisions regarding this patient's long-term care. Identification of specific medications via LCMS testing, something that is not available on the Point of Care Immunoassay screen, is medically necessary in order to provide appropriate care and vigilant compliance monitoring of the use of these potentially dangerous medications.  Opioid agreement signed.  03/03/2019 UDS confirmatory reviewed and appropriate  Opioid risk tool assessment taken  07/29/2018 demonstrates a score of 2. Patient is considered low risk for aberrant behaviors.  Narcan confirmed  received. With the risk of opioid medication usage and overdose, the patient was educated on the risks and potential side effects of opioid medication. Patient has been given naloxone in the event of side effects or adverse reactions or overdose. Patient has demonstrated the proper use along with verbal directions. They have been instructed on how to use naloxone and to call 911 for emergency help in the event of adverse reactions or overdose.   Risks and benefits of the above medication(s) were explained. These include, but are not limited to, respiratory depression, sedation, death, and increased risk of suicide. If applicable, symptoms of serotonin syndrome were explained to the patient as well. The patient expresses understanding of this. The patient claims life is worth living and denies self-harming or suicidal ideations. Patient is advised to contact this clinic with questions/concerns and to report to the Emergency Room or call 911 if any concerning symptoms develop.  I counseled the patient on the danger of opioid therapy and that medications should never be taken except as prescribed. We discussed how extreme caution is to be used whenever they are taking their opioid medications especially as it relates to driving or operating machinery. We discussed that alcohol use without opioid therapy concomitantly is absolutely prohibited. Patient was told about the risk of respiratory depression especially with concomitant use of opioids with benzodiazepines and was advised to tell his/her other providers that he/she is receiving opioids from [place]. Finally, it was stated that misuse of opioids can lead to death.  Plan of care was formulated with my supervising physician  Jacqulyn Bath FNP-BC  Wylene Men MD

## 2019-07-27 NOTE — Assessment & Plan Note (Signed)
History: Current symptoms include anhedonia, depressed mood, difficulty concentrating, fatigue and feelings of worthlessness/guilt. Symptoms have been gradually worsening since that time. Patient denies recurrent thoughts of death, suicidal attempt, suicidal thoughts with specific plan and suicidal thoughts without plan. Previous treatment includes: individual therapy and Similar to EMDR, biofeedback. She complains of the following side effects from the treatment: none. Assessment/Plan:      1. Medications: unchanged. 2. Has a regular therapist. 3. Instructed patient to contact office or on-call physician promptly should condition worsen or any new symptoms appear. IF THE PATIENT HAS ANY SUICIDAL OR HOMICIDAL IDEATIONS, CALL THE OFFICE, DISCUSS WITH A SUPPORT MEMBER, OR GO TO THE ER IMMEDIATELY. Patient was agreeable with this plan.

## 2019-07-31 ENCOUNTER — Ambulatory Visit: Payer: 59 | Admitting: Family Medicine

## 2019-08-10 ENCOUNTER — Encounter: Payer: Self-pay | Admitting: Family Medicine

## 2019-08-28 ENCOUNTER — Telehealth: Payer: 59 | Admitting: Family Medicine

## 2019-08-30 NOTE — Progress Notes (Signed)
Appointment cancelled

## 2019-08-31 ENCOUNTER — Encounter: Payer: 59 | Admitting: Family Medicine

## 2019-08-31 ENCOUNTER — Encounter: Payer: Self-pay | Admitting: Family Medicine

## 2019-08-31 MED ORDER — TRETINOIN 0.01 % EX GEL: Freq: Every day | CUTANEOUS | 3 refills | Status: AC

## 2019-08-31 MED ORDER — QUETIAPINE FUMARATE 100 MG PO TABS: 100.0000 mg | ORAL_TABLET | Freq: Every day | ORAL | 11 refills | Status: DC

## 2019-08-31 MED ORDER — BUTALBITAL-APAP-CAFF-COD 50-325-40-30 MG PO CAPS: 1.0000 | ORAL_CAPSULE | ORAL | 5 refills | Status: DC | PRN

## 2019-08-31 MED ORDER — CLONAZEPAM 1 MG PO TABS: 1.0000 mg | ORAL_TABLET | Freq: Every day | ORAL | 5 refills | Status: DC | PRN

## 2019-10-12 ENCOUNTER — Encounter: Payer: Self-pay | Admitting: Internal Medicine

## 2019-10-12 ENCOUNTER — Ambulatory Visit: Payer: 59 | Admitting: Internal Medicine

## 2019-10-12 ENCOUNTER — Other Ambulatory Visit: Payer: Self-pay

## 2019-10-12 DIAGNOSIS — G901 Familial dysautonomia [Riley-Day]: Secondary | ICD-10-CM | POA: Diagnosis not present

## 2019-10-12 DIAGNOSIS — F331 Major depressive disorder, recurrent, moderate: Secondary | ICD-10-CM | POA: Diagnosis not present

## 2019-10-12 DIAGNOSIS — G90512 Complex regional pain syndrome I of left upper limb: Secondary | ICD-10-CM | POA: Diagnosis not present

## 2019-10-12 MED ORDER — DULOXETINE HCL 30 MG PO CPEP: 30.0000 mg | ORAL_CAPSULE | Freq: Every day | ORAL | 3 refills | Status: DC

## 2019-10-12 NOTE — Assessment & Plan Note (Signed)
Sees the pain doctor
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
## 2019-10-12 NOTE — Assessment & Plan Note (Signed)
Chronic anxiety but daily depression with the chronic pain, etc seroquel some help Clonazepam helps sleep Sees pain doctor Will try adding low dose duloxetine

## 2019-10-12 NOTE — Assessment & Plan Note (Signed)
Is on midodrine, etc

## 2019-11-09 MED ORDER — DULOXETINE HCL 60 MG PO CPEP: 60.0000 mg | ORAL_CAPSULE | Freq: Every day | ORAL | 0 refills | Status: DC

## 2019-12-11 ENCOUNTER — Telehealth: Payer: Self-pay | Admitting: Internal Medicine

## 2019-12-11 MED ORDER — DULOXETINE HCL 60 MG PO CPEP: 60.0000 mg | ORAL_CAPSULE | Freq: Every day | ORAL | 5 refills | Status: DC

## 2019-12-11 NOTE — Telephone Encounter (Signed)
I thought it was just set up for the right pharmacy--I didn't even check. Please cancel the Rx in [place]

## 2019-12-11 NOTE — Telephone Encounter (Signed)
Pt was prescribed Cymbalta 60mg  #90 tablets on 11/09/19 Insurance would not fill for 90 day supply - only filled for 30 day supply and now she is due for a refill and they cannot refill off the old RX. She is needing a new Rx for 30 day supply with refills.

## 2019-12-11 NOTE — Telephone Encounter (Signed)
New Rx sent for 30 day supply

## 2019-12-11 NOTE — Telephone Encounter (Signed)
Pt needs the Cymbalta called into [place].   The Rx was sent to the wrong [facility] in [place]

## 2019-12-12 NOTE — Telephone Encounter (Signed)
Rx cancelled.   Nothing further needed.

## 2020-01-17 ENCOUNTER — Other Ambulatory Visit: Payer: Self-pay

## 2020-01-17 ENCOUNTER — Encounter: Payer: Self-pay | Admitting: Internal Medicine

## 2020-01-17 ENCOUNTER — Ambulatory Visit: Payer: 59 | Admitting: Internal Medicine

## 2020-01-17 VITALS — BP 108/68 | HR 94 | Ht 65.0 in | Wt 110.0 lb

## 2020-01-17 DIAGNOSIS — F411 Generalized anxiety disorder: Secondary | ICD-10-CM | POA: Diagnosis not present

## 2020-01-17 DIAGNOSIS — F331 Major depressive disorder, recurrent, moderate: Secondary | ICD-10-CM | POA: Diagnosis not present

## 2020-01-17 MED ORDER — CLONAZEPAM 1 MG PO TABS: 0.5000 mg | ORAL_TABLET | Freq: Two times a day (BID) | ORAL | 0 refills | Status: DC | PRN
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
## 2020-01-17 NOTE — Assessment & Plan Note (Signed)
Clonazepam helps sleep Will allow daytime dose if needed for anxiety

## 2020-01-17 NOTE — Assessment & Plan Note (Signed)
Definitely better on the duloxetine  Will continue this as well as the quetiapine at bedtime

## 2020-02-16 ENCOUNTER — Other Ambulatory Visit: Payer: Self-pay | Admitting: Internal Medicine

## 2020-02-16 NOTE — Telephone Encounter (Signed)
Last filled 01-17-20 #60 Last OV 01-17-20 Next OV 07-15-20 [place]

## 2020-03-20 ENCOUNTER — Other Ambulatory Visit: Payer: Self-pay | Admitting: Internal Medicine

## 2020-03-21 NOTE — Telephone Encounter (Signed)
Last filled 02-16-20 #60 Last OV 01-17-20 Next OV 07-15-20 Holly Wolf

## 2020-04-22 ENCOUNTER — Other Ambulatory Visit: Payer: Self-pay | Admitting: Internal Medicine

## 2020-04-22 NOTE — Telephone Encounter (Signed)
Last filled 03-20-20 #60 Last OV 01-17-20 Next OV 07-15-20 [place]

## 2020-04-25 ENCOUNTER — Other Ambulatory Visit: Payer: Self-pay

## 2020-04-25 DIAGNOSIS — Z8669 Personal history of other diseases of the nervous system and sense organs: Secondary | ICD-10-CM

## 2020-04-25 MED ORDER — BUTALBITAL-APAP-CAFF-COD 50-325-40-30 MG PO CAPS: 1.0000 | ORAL_CAPSULE | ORAL | 0 refills | Status: AC | PRN

## 2020-04-25 NOTE — Telephone Encounter (Signed)
Last written 08-31-19 #60/5 by Dr Deborra Medina Last OV 01-17-20 Next OV 07-15-20 [place]

## 2020-05-23 ENCOUNTER — Other Ambulatory Visit: Payer: Self-pay

## 2020-05-23 MED ORDER — CLONAZEPAM 1 MG PO TABS: 0.5000 mg | ORAL_TABLET | Freq: Two times a day (BID) | ORAL | 0 refills | Status: DC | PRN

## 2020-05-23 NOTE — Telephone Encounter (Signed)
Last filled 04-22-20 #60 Last OV 01-17-20 Next OV 07-15-20 [place]

## 2020-06-24 ENCOUNTER — Other Ambulatory Visit: Payer: Self-pay

## 2020-06-24 MED ORDER — CLONAZEPAM 1 MG PO TABS: 0.5000 mg | ORAL_TABLET | Freq: Two times a day (BID) | ORAL | 0 refills | Status: AC | PRN

## 2020-06-24 NOTE — Telephone Encounter (Signed)
Last filled 05-23-20 #60 Last OV 01-17-20 Next OV 07-15-20 [place]

## 2020-07-15 ENCOUNTER — Encounter: Payer: 59 | Admitting: Internal Medicine

## 2020-08-21 ENCOUNTER — Other Ambulatory Visit: Payer: Self-pay | Admitting: Obstetrics and Gynecology

## 2020-08-21 ENCOUNTER — Other Ambulatory Visit (HOSPITAL_COMMUNITY): Payer: Self-pay | Admitting: Physician Assistant

## 2020-08-21 ENCOUNTER — Other Ambulatory Visit: Payer: Self-pay | Admitting: Physician Assistant

## 2020-08-21 DIAGNOSIS — R928 Other abnormal and inconclusive findings on diagnostic imaging of breast: Secondary | ICD-10-CM

## 2020-08-21 DIAGNOSIS — K219 Gastro-esophageal reflux disease without esophagitis: Secondary | ICD-10-CM

## 2020-08-21 DIAGNOSIS — R6881 Early satiety: Secondary | ICD-10-CM

## 2020-08-30 ENCOUNTER — Ambulatory Visit (HOSPITAL_COMMUNITY): Payer: 59

## 2020-08-30 ENCOUNTER — Encounter (HOSPITAL_COMMUNITY): Payer: Self-pay

## 2020-09-05 ENCOUNTER — Ambulatory Visit: Payer: 59

## 2020-09-05 ENCOUNTER — Other Ambulatory Visit: Payer: Self-pay

## 2020-09-05 ENCOUNTER — Ambulatory Visit
Admission: RE | Admit: 2020-09-05 | Discharge: 2020-09-05 | Disposition: A | Discharge status: Home / Self Care | Payer: 59 | Source: Ambulatory Visit | Attending: Obstetrics and Gynecology | Admitting: Obstetrics and Gynecology

## 2020-09-05 DIAGNOSIS — R928 Other abnormal and inconclusive findings on diagnostic imaging of breast: Secondary | ICD-10-CM

## 2020-09-14 ENCOUNTER — Other Ambulatory Visit: Payer: Self-pay

## 2020-09-14 ENCOUNTER — Emergency Department
Admission: EM | Admit: 2020-09-14 | Discharge: 2020-09-15 | Disposition: A | Discharge status: Home / Self Care | Payer: 59 | Attending: Student in an Organized Health Care Education/Training Program | Admitting: Student in an Organized Health Care Education/Training Program

## 2020-09-14 DIAGNOSIS — F32A Depression, unspecified: Secondary | ICD-10-CM | POA: Diagnosis not present

## 2020-09-14 DIAGNOSIS — Z23 Encounter for immunization: Secondary | ICD-10-CM | POA: Insufficient documentation

## 2020-09-14 DIAGNOSIS — R45851 Suicidal ideations: Secondary | ICD-10-CM | POA: Diagnosis not present

## 2020-09-14 DIAGNOSIS — Z7289 Other problems related to lifestyle: Secondary | ICD-10-CM

## 2020-09-14 DIAGNOSIS — Z8541 Personal history of malignant neoplasm of cervix uteri: Secondary | ICD-10-CM | POA: Insufficient documentation

## 2020-09-14 DIAGNOSIS — F411 Generalized anxiety disorder: Secondary | ICD-10-CM | POA: Diagnosis not present

## 2020-09-14 DIAGNOSIS — T1491XA Suicide attempt, initial encounter: Secondary | ICD-10-CM | POA: Diagnosis not present

## 2020-09-14 DIAGNOSIS — Z20822 Contact with and (suspected) exposure to covid-19: Secondary | ICD-10-CM | POA: Insufficient documentation

## 2020-09-14 DIAGNOSIS — F418 Other specified anxiety disorders: Secondary | ICD-10-CM | POA: Diagnosis present

## 2020-09-14 DIAGNOSIS — X79XXXA Intentional self-harm by blunt object, initial encounter: Secondary | ICD-10-CM | POA: Diagnosis not present

## 2020-09-14 LAB — CBC WITH DIFFERENTIAL/PLATELET
Abs Immature Granulocytes: 0.04 10*3/uL (ref 0.00–0.07)
Basophils Absolute: 0 10*3/uL (ref 0.0–0.1)
Basophils Relative: 0 %
Eosinophils Absolute: 0.2 10*3/uL (ref 0.0–0.5)
Eosinophils Relative: 2 %
HCT: 37.7 % (ref 36.0–46.0)
Hemoglobin: 12.3 g/dL (ref 12.0–15.0)
Immature Granulocytes: 0 %
Lymphocytes Relative: 15 %
Lymphs Abs: 1.6 10*3/uL (ref 0.7–4.0)
MCH: 28.9 pg (ref 26.0–34.0)
MCHC: 32.6 g/dL (ref 30.0–36.0)
MCV: 88.5 fL (ref 80.0–100.0)
Monocytes Absolute: 0.5 10*3/uL (ref 0.1–1.0)
Monocytes Relative: 5 %
Neutro Abs: 7.8 10*3/uL — ABNORMAL HIGH (ref 1.7–7.7)
Neutrophils Relative %: 78 %
Platelets: 184 10*3/uL (ref 150–400)
RBC: 4.26 MIL/uL (ref 3.87–5.11)
RDW: 13.1 % (ref 11.5–15.5)
WBC: 10.1 10*3/uL (ref 4.0–10.5)
nRBC: 0 % (ref 0.0–0.2)

## 2020-09-14 LAB — COMPREHENSIVE METABOLIC PANEL
ALT: 52 U/L — ABNORMAL HIGH (ref 0–44)
AST: 118 U/L — ABNORMAL HIGH (ref 15–41)
Albumin: 3.7 g/dL (ref 3.5–5.0)
Alkaline Phosphatase: 44 U/L (ref 38–126)
Anion gap: 7 (ref 5–15)
BUN: 8 mg/dL (ref 6–20)
CO2: 30 mmol/L (ref 22–32)
Calcium: 8.8 mg/dL — ABNORMAL LOW (ref 8.9–10.3)
Chloride: 97 mmol/L — ABNORMAL LOW (ref 98–111)
Creatinine, Ser: 0.72 mg/dL (ref 0.44–1.00)
GFR, Estimated: >60 mL/min (ref >60)
Glucose, Bld: 89 mg/dL (ref 70–99)
Potassium: 3.7 mmol/L (ref 3.5–5.1)
Sodium: 134 mmol/L — ABNORMAL LOW (ref 135–145)
Total Bilirubin: 0.7 mg/dL (ref 0.3–1.2)
Total Protein: 6.9 g/dL (ref 6.5–8.1)

## 2020-09-14 LAB — RESP PANEL BY RT-PCR (FLU A&B, COVID) ARPGX2
Influenza A by PCR: NEGATIVE
Influenza B by PCR: NEGATIVE
SARS Coronavirus 2 by RT PCR: NEGATIVE

## 2020-09-14 LAB — ACETAMINOPHEN LEVEL: Acetaminophen (Tylenol), Serum: <10 ug/mL — ABNORMAL LOW (ref 10–30)

## 2020-09-14 LAB — SALICYLATE LEVEL: Salicylate Lvl: <7 mg/dL — ABNORMAL LOW (ref 7.0–30.0)

## 2020-09-14 MED ORDER — TETANUS-DIPHTH-ACELL PERTUSSIS 5-2.5-18.5 LF-MCG/0.5 IM SUSY
0.5000 mL | PREFILLED_SYRINGE | Freq: Once | INTRAMUSCULAR | Status: AC
  Administered 2020-09-15: 0.5 mL via INTRAMUSCULAR
  Filled 2020-09-14: qty 0.5

## 2020-09-14 NOTE — ED Notes (Signed)
Superficial scratches noted on left anterior forearm, dressed with bandaid's. Pt changed into psych scrubs at this time. [phone] is pt's ex-husbands number.

## 2020-09-14 NOTE — ED Notes (Signed)
Pt moved to hallway and provided warm blankets.

## 2020-09-14 NOTE — ED Notes (Addendum)
This nurse counted controlled substance medication with April RN as followed  Methadone-31 total white in color  Clonazepam-48 total light blue in color  CBD gummies- 19 in total red and white in color   Medication was placed in security bag and handed off to pharmacy at this time
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
## 2020-09-14 NOTE — ED Notes (Signed)
Pt given labeled specimen cup for UA.

## 2020-09-14 NOTE — ED Notes (Addendum)
Pt has medication bottles for the following placed in security bag and given to pharmacy   Aripiprazole 5 mg tab  Aripiprazole 10 mg tab  Cyclobenzaprine- 7.5 mg tab  Bupropion- 150 mg tab  Duloxetine-30mg   Calcium citrate- 200 mg  Biotin- 5000 Mcg  Belsomra-20mg  tab

## 2020-09-14 NOTE — ED Notes (Signed)
Pt is still wearing watch- states her cardiologist requires it for monitoring, pt is also still wearing on t-shirt under her scrub top and medical compression sleeve on left forearm, and a binder, which are medically indicated.

## 2020-09-14 NOTE — ED Notes (Signed)
Pt belongings handed off to oncoming RN K[place].
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
## 2020-09-15 DIAGNOSIS — T1491XA Suicide attempt, initial encounter: Secondary | ICD-10-CM

## 2020-09-15 DIAGNOSIS — F32A Depression, unspecified: Secondary | ICD-10-CM

## 2020-09-15 DIAGNOSIS — X79XXXA Intentional self-harm by blunt object, initial encounter: Secondary | ICD-10-CM

## 2020-09-15 DIAGNOSIS — F411 Generalized anxiety disorder: Secondary | ICD-10-CM

## 2020-09-15 LAB — POC URINE PREG, ED: Preg Test, Ur: NEGATIVE

## 2020-09-15 LAB — URINE DRUG SCREEN, QUALITATIVE (ARMC ONLY)
Amphetamines, Ur Screen: NOT DETECTED
Barbiturates, Ur Screen: POSITIVE — AB
Benzodiazepine, Ur Scrn: POSITIVE — AB
Cannabinoid 50 Ng, Ur ~~LOC~~: POSITIVE — AB
Cocaine Metabolite,Ur ~~LOC~~: NOT DETECTED
MDMA (Ecstasy)Ur Screen: NOT DETECTED
Methadone Scn, Ur: POSITIVE — AB
Opiate, Ur Screen: POSITIVE — AB
Phencyclidine (PCP) Ur S: NOT DETECTED
Tricyclic, Ur Screen: POSITIVE — AB

## 2020-09-15 MED ORDER — BUPROPION HCL ER (XL) 150 MG PO TB24
150.0000 mg | ORAL_TABLET | Freq: Every morning | ORAL | Status: DC
Start: 2020-09-15 — End: 2020-09-15
  Administered 2020-09-15: 150 mg via ORAL
  Filled 2020-09-15: qty 1

## 2020-09-15 MED ORDER — CLONAZEPAM 0.5 MG PO TABS
0.5000 mg | ORAL_TABLET | Freq: Two times a day (BID) | ORAL | Status: DC | PRN
Start: 2020-09-15 — End: 2020-09-15
  Administered 2020-09-15: 0.5 mg via ORAL
  Filled 2020-09-15: qty 1

## 2020-09-15 MED ORDER — ARIPIPRAZOLE 10 MG PO TABS
10.0000 mg | ORAL_TABLET | Freq: Every day | ORAL | Status: DC
  Filled 2020-09-15: qty 1

## 2020-09-15 MED ORDER — ARIPIPRAZOLE 5 MG PO TABS
5.0000 mg | ORAL_TABLET | Freq: Every morning | ORAL | Status: DC
  Administered 2020-09-15: 5 mg via ORAL
  Filled 2020-09-15: qty 1

## 2020-09-15 MED ORDER — METHADONE HCL 10 MG PO TABS
5.0000 mg | ORAL_TABLET | Freq: Two times a day (BID) | ORAL | Status: DC
Start: 2020-09-15 — End: 2020-09-15
  Administered 2020-09-15: 5 mg via ORAL
  Filled 2020-09-15: qty 1

## 2020-09-15 MED ORDER — DULOXETINE HCL 60 MG PO CPEP
90.0000 mg | ORAL_CAPSULE | Freq: Every morning | ORAL | Status: DC
  Administered 2020-09-15: 90 mg via ORAL
  Filled 2020-09-15: qty 1

## 2020-09-15 MED ORDER — DILTIAZEM HCL ER COATED BEADS 120 MG PO CP24
120.0000 mg | ORAL_CAPSULE | Freq: Every day | ORAL | Status: DC
  Administered 2020-09-15: 120 mg via ORAL
  Filled 2020-09-15: qty 1

## 2020-09-15 NOTE — ED Provider Notes (Signed)
-----------------------------------------   11:36 AM on 09/15/2020 -----------------------------------------  Patient has been accepted to [facility].  Currently arranging transport.   Harvest Dark, MD 09/15/20 1136
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
## 2020-09-15 NOTE — ED Notes (Signed)
Pt given coloring book and crayons 
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
## 2020-09-15 NOTE — ED Notes (Signed)
admission Holly Wolf made aware pt has own supply of medication and will be sent with the transporting deputy. Sheriff deputy here at bedside to transport patient.

## 2020-09-15 NOTE — ED Notes (Signed)
Pt given breakfast tray and juice. Pt states she's not hungry, encouraged pt to eat and drink.

## 2020-09-15 NOTE — ED Notes (Addendum)
Pt. Alert and oriented, skin warm and dry, in no distress. Pt. Denies SI, HI, and AVH. States sometime has AH due to med combination but not at this time.Pt. Encouraged to let nursing staff know of any concerns or needs. Pt tearful about being brought to hospital by her friend and feels betrayed as they had a deal. Pt anxious to be discharged.

## 2020-09-15 NOTE — ED Notes (Signed)
After pt spoke to psych and TTS team pt began to cry and hit her head against the wall this RN and security officer rushed over and pt stood up and reported "I'm leaving" pt oriented to IVC, pt tearful, reporting multiple concerns, pt very excited and difficult to follow, pressured speech and then accused this RN of intimidating her, pt alerted security to this, this RN unable to deescalate pt, apologized and attempted to walk to charge desk and then pt reported "where are you going I thought we were talking"
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
## 2020-09-15 NOTE — ED Notes (Signed)
Pt on phone at this time 

## 2020-09-15 NOTE — BH Assessment (Signed)
PATIENT IS SCHEDULED FOR ADMISSION ANYTIME TODAY  Patient has been accepted to [facility].  Patient assigned to: [place] Accepting physician is Dr. Selinda Flavin Call report to [phone] Representative was Alta Corning    ER Staff is aware of it:  Olivia Mackie, ER Secretary  Kerman Passey, ER MD  Harrell Gave, Patient's Nurse     Patient's Family/Support System (Adam Smith-Spouse,([phone]) have been updated as well.

## 2020-09-15 NOTE — BH Assessment (Signed)
Referral information for Psychiatric Hospitalization faxed to;   [facility] [phone]),   . [facility] [phone]),  . [facility] [phone]),   . [facility] [phone]- [phone]),   . [facility] [phone]),   . [facility] [phone]).  [facility] [phone])

## 2020-09-15 NOTE — ED Notes (Signed)
Report called to [facility]. When questioned about meds Curlene Dolphin at [facility] stated they do not dispense Methadone at their facility unless a prescription is sent. Md made aware.

## 2020-09-15 NOTE — ED Notes (Signed)
Medications picked up from pharmacy. Medications given to sheriff deputy to give to accepting facility. Pt escorted out of hospital without incident.

## 2020-09-16 ENCOUNTER — Other Ambulatory Visit: Payer: Self-pay | Admitting: Internal Medicine

## 2020-09-16 DIAGNOSIS — G901 Familial dysautonomia [Riley-Day]: Secondary | ICD-10-CM

## 2020-09-16 DIAGNOSIS — G90A Postural orthostatic tachycardia syndrome (POTS): Secondary | ICD-10-CM

## 2020-09-16 DIAGNOSIS — I498 Other specified cardiac arrhythmias: Secondary | ICD-10-CM

## 2020-10-02 ENCOUNTER — Other Ambulatory Visit: Payer: 59

## 2020-10-09 ENCOUNTER — Encounter: Payer: 59 | Admitting: Internal Medicine

## 2021-07-08 IMAGING — MG MM DIGITAL DIAGNOSTIC UNILAT*R* W/ TOMO W/ CAD
4 series · 4 of 12 positions shown · non-contrast
Comparison: Baseline screening mammogram dated 08/19/2020.

CLINICAL DATA: Possible mass in the upper outer right breast on a
recent baseline screening mammogram.

EXAM:
DIGITAL DIAGNOSTIC UNILATERAL RIGHT MAMMOGRAM WITH TOMOSYNTHESIS AND
CAD
TECHNIQUE: Right digital diagnostic mammography and breast tomosynthesis was
performed. The images were evaluated with computer-aided detection.

[R CC synth-2D]
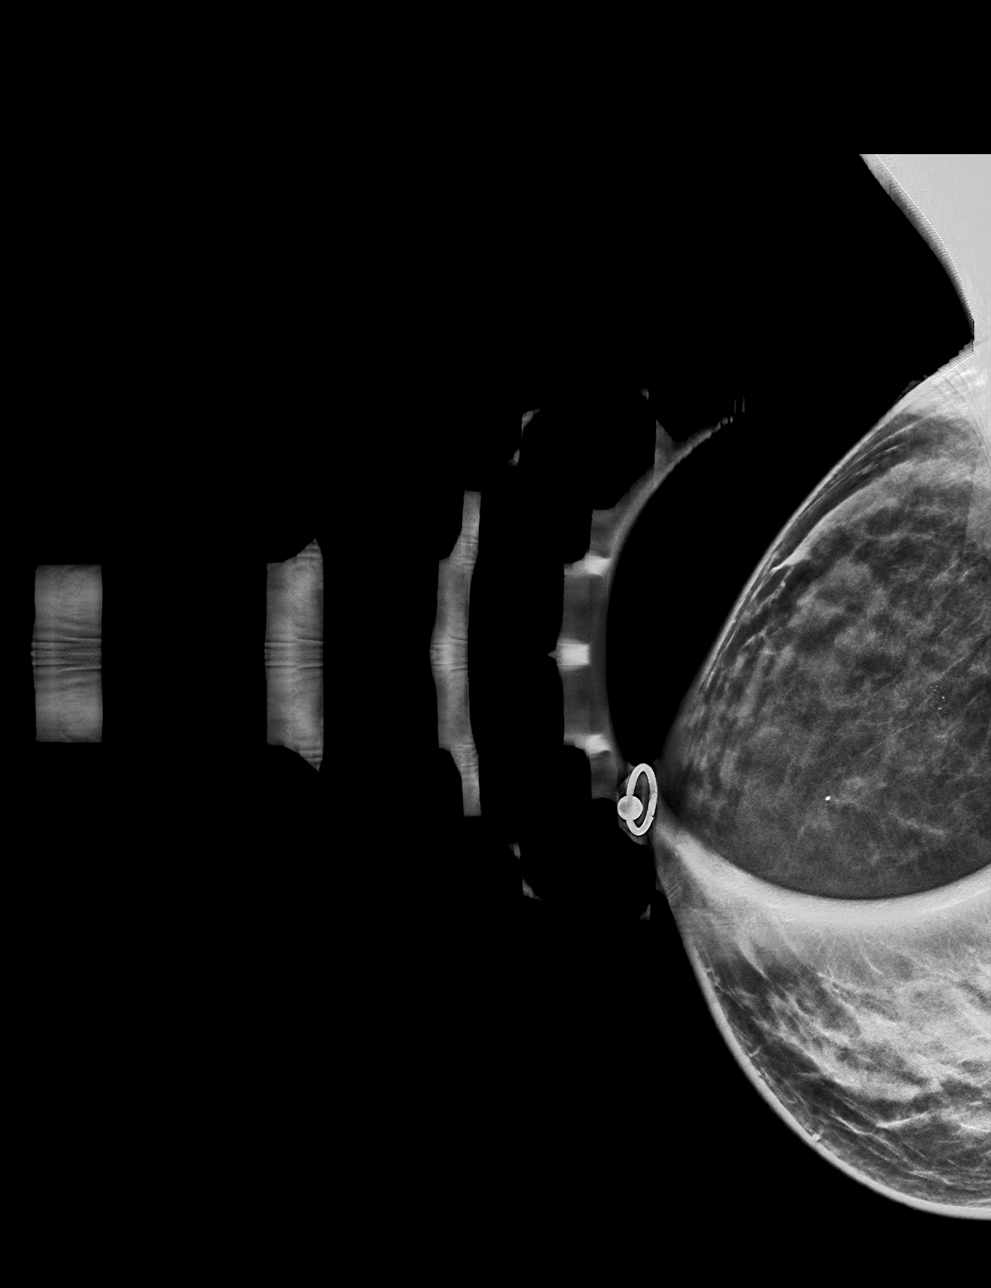

[R MLO synth-2D]
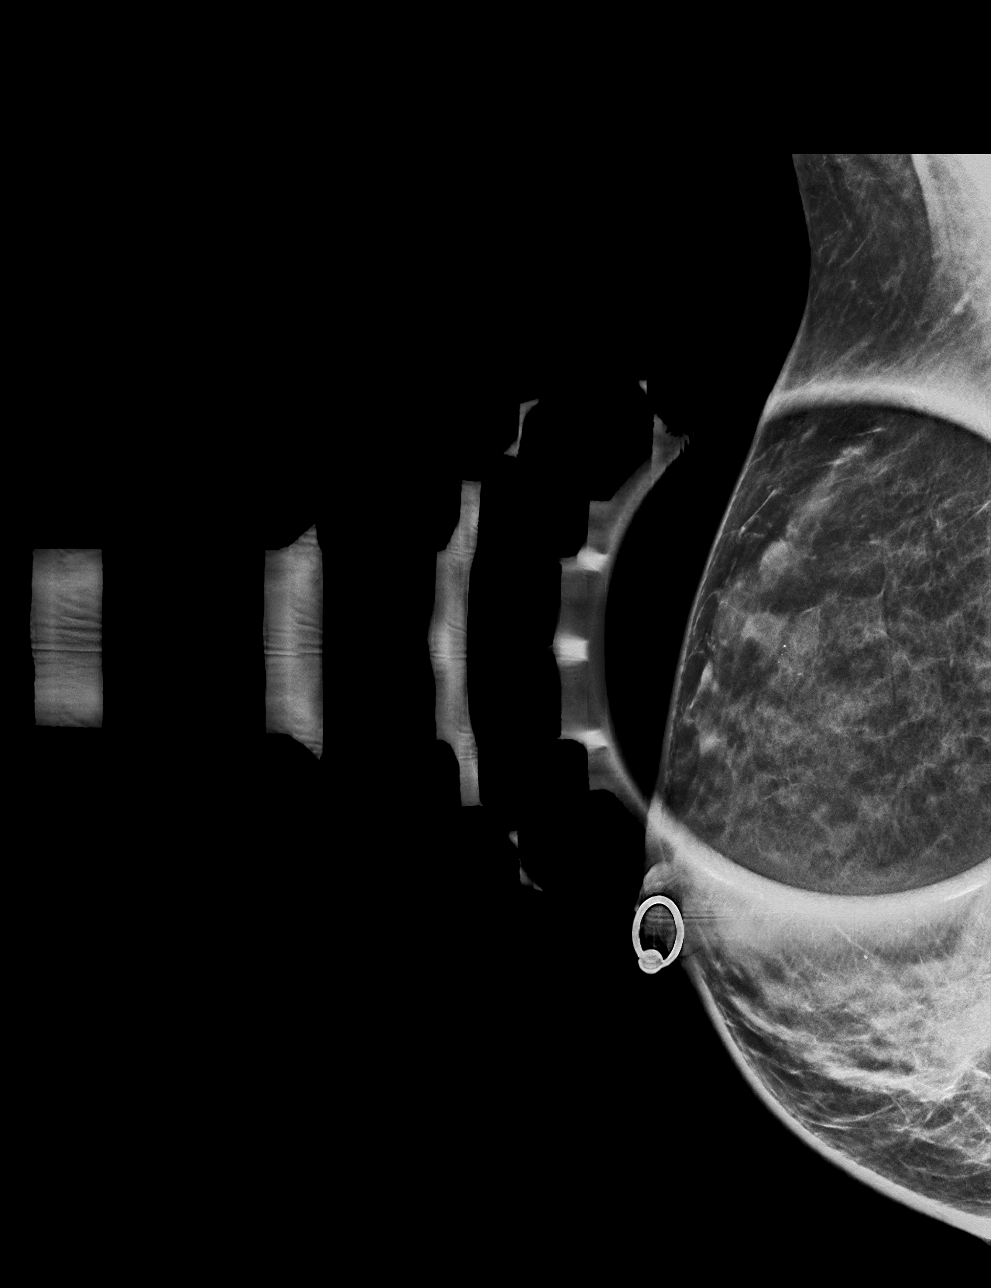

[R CC tomo · tomo slice 22/43.0]
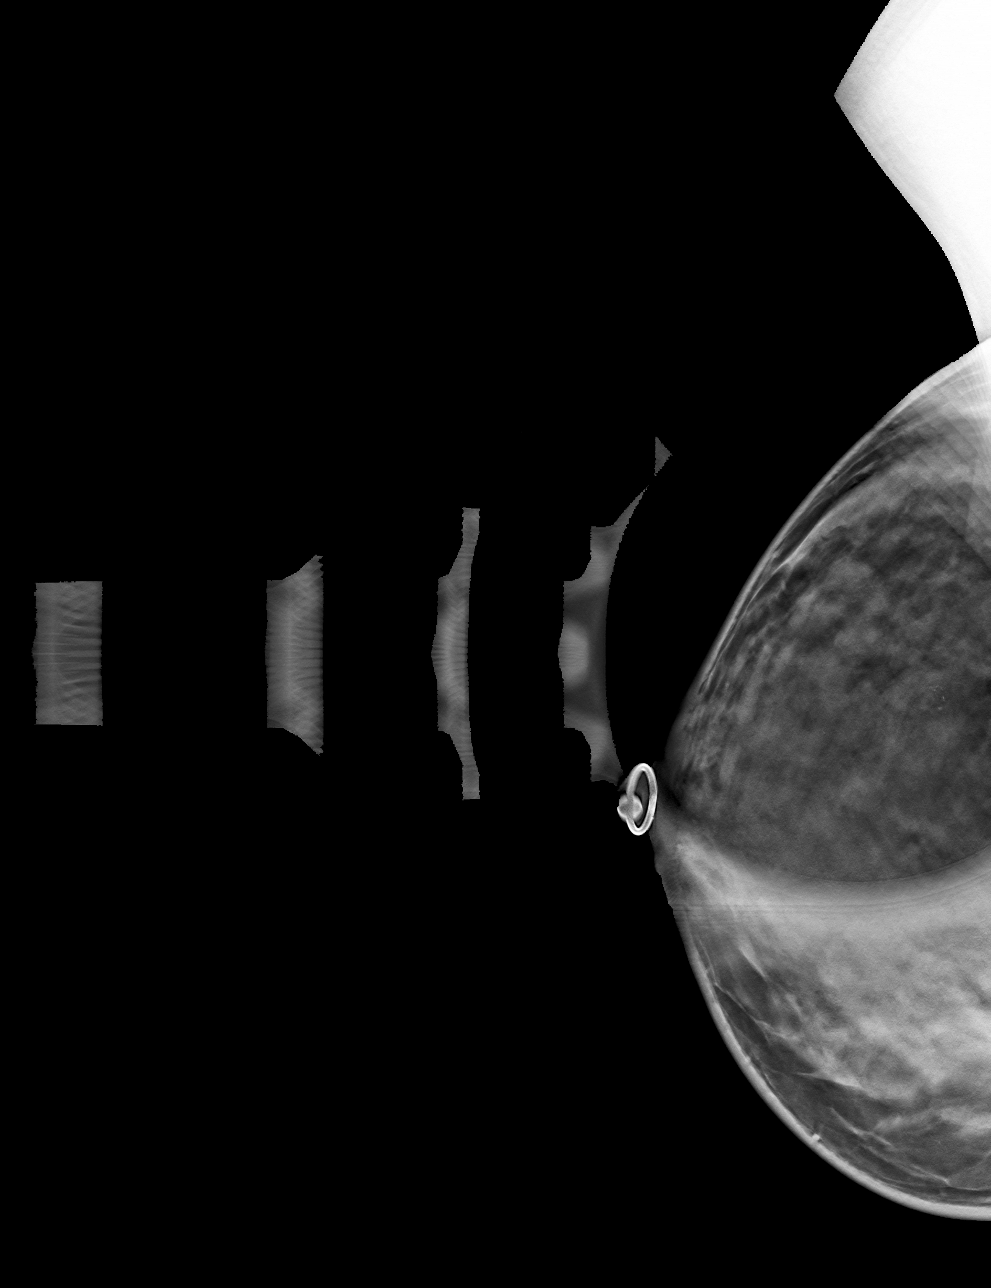

[R MLO tomo · tomo slice 21/42.0]
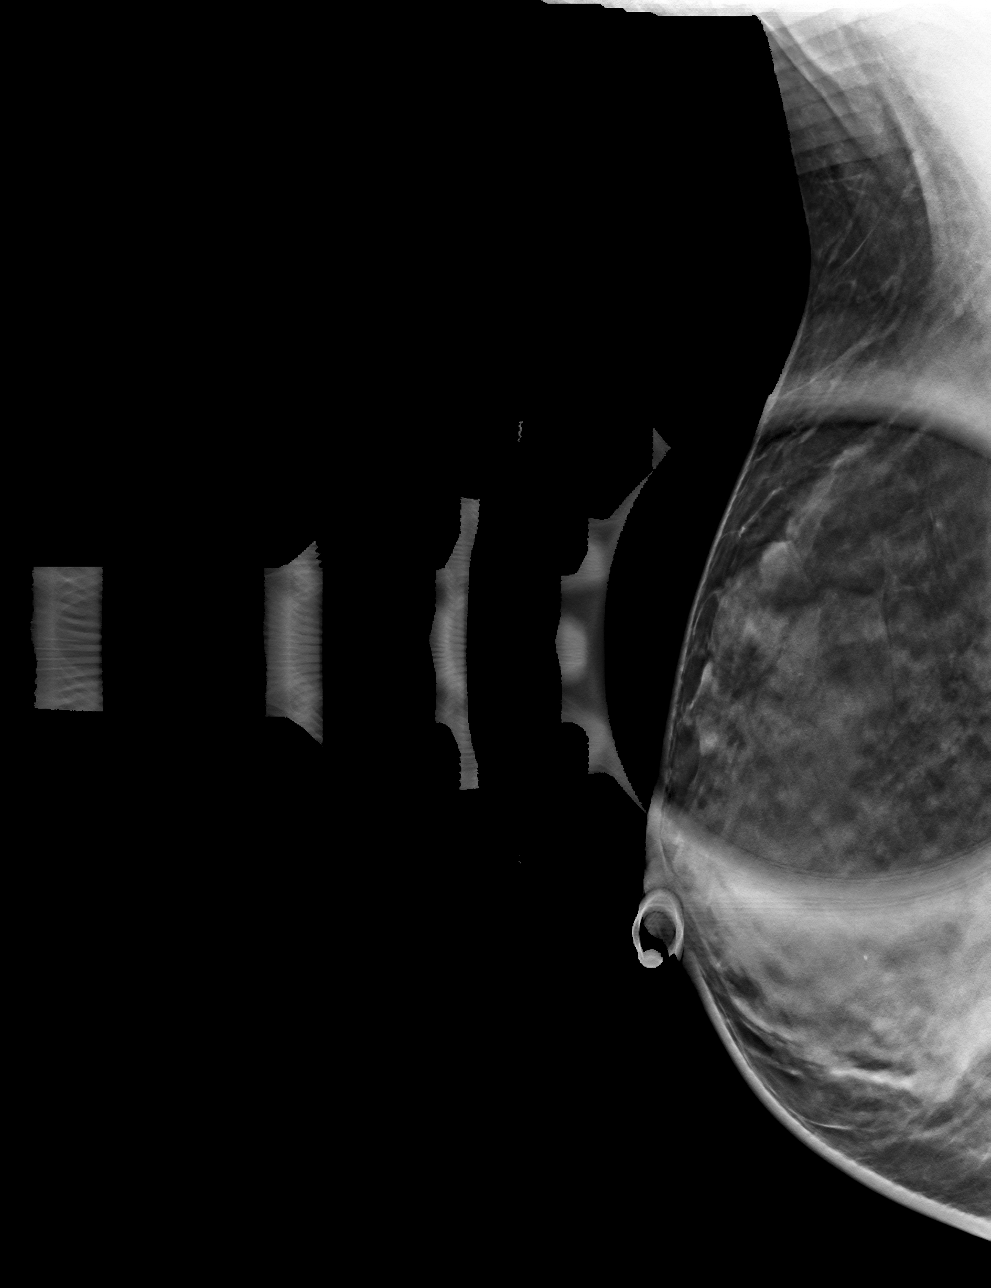

[4 of 12 positions shown; findings below may reference images not displayed]

ACR Breast Density Category d: The breast tissue is extremely dense,
which lowers the sensitivity of mammography.
FINDINGS: 3D tomographic and 2D generated spot compression images of the right
breast demonstrate normal appearing fibroglandular tissue at the
location of the recently suspected mass. There is also a small group
calcifications at that location. These are rounded in the
craniocaudal projection and demonstrate dependent layering in the
oblique projection.
IMPRESSION: 1. No evidence of malignancy.
2. Benign milk calcium in the upper outer right breast.
3. The recently suspected right breast mass was close apposition of
normal breast tissue.

RECOMMENDATION:
Bilateral screening mammogram in 1 year.

I have discussed the findings and recommendations with the patient.
If applicable, a reminder letter will be sent to the patient
regarding the next appointment.

BI-RADS CATEGORY  2: Benign.

## 2021-09-04 ENCOUNTER — Other Ambulatory Visit: Payer: Self-pay

## 2021-09-04 ENCOUNTER — Ambulatory Visit
Admission: RE | Admit: 2021-09-04 | Discharge: 2021-09-04 | Disposition: A | Discharge status: Home / Self Care | Payer: 59 | Source: Ambulatory Visit | Attending: Nurse Practitioner | Admitting: Nurse Practitioner

## 2021-09-04 ENCOUNTER — Ambulatory Visit
Admission: RE | Admit: 2021-09-04 | Discharge: 2021-09-04 | Disposition: A | Discharge status: Home / Self Care | Payer: 59 | Attending: Nurse Practitioner | Admitting: Nurse Practitioner

## 2021-09-04 ENCOUNTER — Ambulatory Visit (INDEPENDENT_AMBULATORY_CARE_PROVIDER_SITE_OTHER): Payer: Self-pay | Admitting: Nurse Practitioner

## 2021-09-04 ENCOUNTER — Ambulatory Visit: Payer: Self-pay | Admitting: Nurse Practitioner

## 2021-09-04 VITALS — BP 130/98 | HR 108 | Temp 98.0°F | Resp 14 | Ht 65.0 in | Wt 111.4 lb

## 2021-09-04 DIAGNOSIS — M545 Low back pain, unspecified: Secondary | ICD-10-CM

## 2021-09-04 DIAGNOSIS — Z9689 Presence of other specified functional implants: Secondary | ICD-10-CM | POA: Diagnosis not present

## 2021-09-04 DIAGNOSIS — R8290 Unspecified abnormal findings in urine: Secondary | ICD-10-CM | POA: Insufficient documentation

## 2021-09-04 DIAGNOSIS — G90512 Complex regional pain syndrome I of left upper limb: Secondary | ICD-10-CM

## 2021-09-04 LAB — POCT URINALYSIS DIPSTICK
Bilirubin, UA: NEGATIVE
Glucose, UA: NEGATIVE
Ketones, UA: POSITIVE
Nitrite, UA: NEGATIVE
Protein, UA: POSITIVE — AB
Spec Grav, UA: 1.015 (ref 1.010–1.025)
Urobilinogen, UA: 0.2 E.U./dL
pH, UA: 6 (ref 5.0–8.0)

## 2021-09-04 LAB — POCT URINE PREGNANCY: Preg Test, Ur: NEGATIVE

## 2021-09-04 NOTE — Patient Instructions (Signed)
Nice to see you today I will be in touch in regards to the xray results Follow up with Primary pain providers

## 2021-09-04 NOTE — Assessment & Plan Note (Signed)
Given patient's symptoms checked urinalysis in office.  Did have leukocytes 3+.  After joint discussion with patient and discussing findings patient declines to start antibiotic use currently.  She states she would like to wait for urine culture to result as she does not feel like she has a urinary tract infection.  Did discuss with patient that the urinary culture could take several days to come back she is in agreements with this continue to monitor symptoms pending urinary culture

## 2021-09-04 NOTE — Assessment & Plan Note (Signed)
Patient has a spinal cord stimulator placed on left medial thoracic to lower back.  Was placed by Dr. With illness in [place] but has since had her care transferred to a pain provider in [place].  Patient states that she feels the leads moving with movement is concerned that they are displaced.  She is having increasing pain and is scared that if she waits another 2-1/2 weeks she will not be able to get back to her Primary pain provider.  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  Form filled out and original copy given to patient copy To be scanned to patient's chart.  Patient did have lumbar spine picture that did not show any apparent dislocation of leads nor spinal cord stimulator itself.  But no old films for comparison

## 2021-09-04 NOTE — Assessment & Plan Note (Signed)
Patient exquisitely tender on exam pan lumbar area muscular and bony tenderness.  Patient is guarding during exam does have decent strength but some weakness due to pain it seems.  No red flags in office.  Do agree that patient needs to travel back home to see her primary pain provider as her current INR NSR medications along with other medications are not controlling her pain adequately.  Patient did look uncomfortable in office.  Was slightly tachycardic and increased blood pressures.

## 2021-09-04 NOTE — Progress Notes (Signed)
Acute Office Visit  Subjective:    Patient ID: Holly Wolf, female    DOB: January 10, 1981, 41 y.o.   MRN: [id number]  Chief Complaint  Patient presents with   Back Pain    X 1 week, patient had spinal cord revision done December 2021. Started having trouble about a week ago, she is not sure if its because she has had a dog jumping on her aggravated her back and the stimulator possibly out of place? Patient needs waiver filled out stating that patient needs to go back home to have her back looked at with her provider, has to fly back. She was originally to be here to visit her son for a month and was originally suppose to leave on 09/22/21. Lives in [place] right now.     HPI Patient is in today for Back pain She has a history of CRPS that is being managed through a workman's comp claim and being managed by her primary pain provider Dr. Trenton Founds [facility] [place]. She is on a IR and SR medication along with muscle relaxants.  Came out to visit her son was leave going to leave on 09/22/2021 and  arrived on 08/21/2021.  States that she had a layover in [place] and currenlty co-sleeping in a bed with an animal. Dog did jump on back and after that incident she started to experience greater pain than her baseline Feels like the leads are loose in her back from her spinal cord stimulator and she is experieicng increased pain and having trouble sleeping. Does not pursue injections into back with her pain clinic but she does do IV ketamine treatments. The pain is a sharp pain that does radiate with certain movements. Her spinal cord stimulator was originally placed by Dr. Mechele Dawley with [facility] in [place] that her spouse left a Jenny Reichmann dear letter that subsequently resulted in her having a behavioral health crisis and having to be treated inpatient, Since then she has moved to [place] and transferred all her care there.     Past Medical History:  Diagnosis Date   Anxiety    Cancer ([place])  2001   LEEP for cervical cancer   Dysautonomia ([place])    Fatigue    Frequent headaches    Heart murmur     Past Surgical History:  Procedure Laterality Date   WISDOM TOOTH EXTRACTION  1999-2000    Family History  Problem Relation Age of Onset   Hypertension Mother    COPD Mother    Cancer Mother        pancreatic cancer--S/P [place]   Stroke Mother    Hyperlipidemia Father    Hypertension Sister    Migraines Sister    Arthritis Maternal Grandmother    Heart disease Maternal Grandmother    Heart disease Maternal Grandfather    Migraines Brother    Depression Brother    Hyperlipidemia Sister    Migraines Sister    Migraines Brother     Social History   Socioeconomic History   Marital status: Married    Spouse name: Not on file   Number of children: 1   Years of education: Not on file   Highest education level: Not on file  Occupational History   Occupation: DIsabled as Pharmacist, hospital    Comment: PhD in neurobiology  Tobacco Use   Smoking status: Never   Smokeless tobacco: Never  Substance and Sexual Activity   Alcohol use: Yes   Drug  use: No   Sexual activity: Yes  Other Topics Concern   Not on file  Social History Narrative   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 Then CRPS after this      Son   Social Determinants of Health   Financial Resource Strain: Not on file  Food Insecurity: Not on file  Transportation Needs: Not on file  Physical Activity: Not on file  Stress: Not on file  Social Connections: Not on file  Intimate Partner Violence: Not on file    Outpatient Medications Prior to Visit  Medication Sig Dispense Refill   BELSOMRA 20 MG TABS Take 0.5-1 tablets by mouth at bedtime as needed.     buPROPion (WELLBUTRIN XL) 300 MG 24 hr tablet Take 300 mg by mouth daily.     busPIRone (BUSPAR) 10 MG tablet Take 10 mg by mouth 2 (two) times daily.     butalbital-acetaminophen-caffeine  (FIORICET WITH CODEINE) 50-325-40-30 MG capsule Take 1 capsule by mouth every 4 (four) hours as needed for headache. 60 capsule 0   clonazePAM (KLONOPIN) 1 MG tablet Take 0.5-1 tablets (0.5-1 mg total) by mouth 2 (two) times daily as needed for anxiety. (Patient taking differently: Take 1 mg by mouth 2 (two) times daily.) 60 tablet 0   cyclobenzaprine (FEXMID) 7.5 MG tablet Take 1 tablet by mouth in the morning, at noon, in the evening, and at bedtime.     diclofenac Sodium (VOLTAREN) 1 % GEL Voltaren Arthritis Pain 1 % topical gel  APPLY 2 GRAMS TO THE AFFECTED AREA(S) BY TOPICAL ROUTE 4 TIMES PER DAY     Diclofenac Sodium 3 % GEL Apply topically.     diltiazem (CARDIZEM CD) 120 MG 24 hr capsule Take 120 mg by mouth daily.     HYDROcodone-acetaminophen (NORCO) 10-325 MG tablet hydrocodone 10 mg-acetaminophen 325 mg tablet  TAKE 1 TABLET BY MOUTH EVERY 6 HOURS AS NEEDED FOR PAIN FOR 10 DAYS     morphine (MS CONTIN) 30 MG 12 hr tablet Take 30 mg by mouth 3 (three) times daily.     Multiple Vitamins-Minerals (HAIR SKIN AND NAILS FORMULA PO) Take 3 capsules by mouth daily.     ondansetron (ZOFRAN) 8 MG tablet Take 8 mg by mouth every 8 (eight) hours as needed for nausea or vomiting.     promethazine (PHENERGAN) 25 MG tablet Take 25 mg by mouth every 8 (eight) hours as needed.     propranolol (INDERAL) 10 MG tablet Take 10 mg by mouth. Take 1/4 to 1/2 tab daily prn for hear rate spikes above 110     SUMAtriptan (IMITREX) 100 MG tablet Take 100 mg by mouth every 2 (two) hours as needed for migraine or headache. May repeat in 2 hours if headache persists or recurs.     SUMAtriptan Succinate 6 MG/0.5ML SOTJ Inject 0.5 mg ( 6 mg ) into skin as needed use no more than two days per week. 15 mL 3   tretinoin (RETIN-A) 0.01 % gel Apply topically at bedtime. 45 g 3   ARIPiprazole (ABILIFY) 10 MG tablet Take 10 mg by mouth at bedtime.     ARIPiprazole (ABILIFY) 5 MG tablet Take 5 mg by mouth every morning.      bisoprolol (ZEBETA) 10 MG tablet Take 0.5 tablets by mouth 2 (two) times daily. (Patient not taking: No sig reported)     buPROPion (WELLBUTRIN XL) 150 MG 24 hr tablet Take  150 mg by mouth every morning.     [employer] CALCIUM CITRATE PO Take 5 tablets by mouth daily as needed.     DULoxetine (CYMBALTA) 30 MG capsule Take 90 mg by mouth every morning.     DULoxetine (CYMBALTA) 60 MG capsule Take 1 capsule (60 mg total) by mouth daily. (Patient not taking: No sig reported) 30 capsule 5   methadone (DOLOPHINE) 5 MG tablet Take 5 mg by mouth in the morning, at noon, in the evening, and at bedtime.     midodrine (PROAMATINE) 2.5 MG tablet Take 2.5 mg by mouth 3 (three) times daily as needed. (Patient not taking: No sig reported)     ondansetron (ZOFRAN ODT) 4 MG disintegrating tablet Take 1 tablet (4 mg total) by mouth every 8 (eight) hours as needed for nausea or vomiting. (Patient not taking: No sig reported) 20 tablet 0   QUEtiapine (SEROQUEL) 100 MG tablet Take 1 tablet (100 mg total) by mouth at bedtime. 30 tablet 11   terbinafine (LAMISIL) 250 MG tablet Take 250 mg by mouth daily. (Patient not taking: No sig reported)     No facility-administered medications prior to visit.    No Known Allergies  Review of Systems  Constitutional:  Negative for chills and fever.  Gastrointestinal:  Negative for abdominal pain, nausea and vomiting.  Genitourinary:        No B&B involvement  Musculoskeletal:  Positive for arthralgias, back pain and myalgias.  Neurological:  Positive for numbness. Negative for weakness.  Psychiatric/Behavioral:  Negative for hallucinations and suicidal ideas.       Objective:    Physical Exam Vitals and nursing note reviewed.  Constitutional:      Appearance: Normal appearance.  Cardiovascular:     Rate and Rhythm: Normal rate and regular rhythm.     Heart sounds: Normal heart sounds.  Pulmonary:     Effort: Pulmonary effort is normal.     Breath sounds: Normal breath  sounds.  Musculoskeletal:        General: Tenderness present.       Arms:     Lumbar back: Tenderness and bony tenderness present. Positive right straight leg raise test. Negative left straight leg raise test.     Comments: Left arm is wrapped and bound to patients chest due to CRP. States this is a chronic issue and in PT weekly for it  Skin:    General: Skin is warm.  Neurological:     Mental Status: She is alert.     Deep Tendon Reflexes:     Reflex Scores:      Patellar reflexes are 2+ on the right side and 2+ on the left side.    Comments: Flexion and extension of lower extremities 5/5 (at knee) Straight leg raise 4/5 due to pain   Psychiatric:        Mood and Affect: Mood normal.        Behavior: Behavior normal.        Thought Content: Thought content normal.        Judgment: Judgment normal.    There were no vitals taken for this visit. Wt Readings from Last 3 Encounters:  01/17/20 110 lb (49.9 kg)  10/12/19 105 lb (47.6 kg)  02/25/18 109 lb 4 oz (49.6 kg)    Health Maintenance Due  Topic Date Due   Hepatitis C Screening  Never done   COVID-19 Vaccine (2 - Booster for Janssen series) 12/20/2019   PAP SMEAR-Modifier  07/26/2020    There are no preventive care reminders to display for this patient.   Lab Results  Component Value Date   TSH 0.87 12/30/2017   Lab Results  Component Value Date   WBC 10.1 09/14/2020   HGB 12.3 09/14/2020   HCT 37.7 09/14/2020   MCV 88.5 09/14/2020   PLT 184 09/14/2020   Lab Results  Component Value Date   NA 134 (L) 09/14/2020   K 3.7 09/14/2020   CO2 30 09/14/2020   GLUCOSE 89 09/14/2020   BUN 8 09/14/2020   CREATININE 0.72 09/14/2020   BILITOT 0.7 09/14/2020   ALKPHOS 44 09/14/2020   AST 118 (H) 09/14/2020   ALT 52 (H) 09/14/2020   PROT 6.9 09/14/2020   ALBUMIN 3.7 09/14/2020   CALCIUM 8.8 (L) 09/14/2020   ANIONGAP 7 09/14/2020   GFR 82.86 12/30/2017   Lab Results  Component Value Date   CHOL 126 11/14/2013    Lab Results  Component Value Date   HDL 58.40 11/14/2013   Lab Results  Component Value Date   LDLCALC 57 11/14/2013   Lab Results  Component Value Date   TRIG 55.0 11/14/2013   Lab Results  Component Value Date   CHOLHDL 2 11/14/2013   No results found for: HGBA1C     Assessment & Plan:   Problem List Items Addressed This Visit       Nervous and Auditory   CRPS (complex regional pain syndrome), type I, upper - Primary    This is ongoing chronic pain per patient for left upper extremity.  She was followed by Dr. Mechele Dawley in [place] currently transferred to a pain clinic in [place] with [facility]      Relevant Medications   buPROPion (WELLBUTRIN XL) 300 MG 24 hr tablet   busPIRone (BUSPAR) 10 MG tablet   HYDROcodone-acetaminophen (NORCO) 10-325 MG tablet   morphine (MS CONTIN) 30 MG 12 hr tablet     Other   Status post insertion of spinal cord stimulator    Patient has a spinal cord stimulator placed on left medial thoracic to lower back.  Was placed by Dr. With illness in [place] but has since had her care transferred to a pain provider in [place].  Patient states that she feels the leads moving with movement is concerned that they are displaced.  She is having increasing pain and is scared that if she waits another 2-1/2 weeks she will not be able to get back to her Primary pain provider.  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  Form filled out and original copy given to patient copy To be scanned to patient's chart.  Patient did have lumbar spine picture that did not show any apparent dislocation of leads nor spinal cord stimulator itself.  But no old films for comparison      Relevant Orders   DG Lumbar Spine Complete (Completed)   Lumbar pain    Patient exquisitely tender on exam pan lumbar area muscular and bony tenderness.  Patient is guarding during exam does have decent  strength but some weakness due to pain it seems.  No red flags in office.  Do agree that patient needs to travel back home to see her primary pain provider as her current INR NSR medications along with other medications are not controlling her pain adequately.  Patient did look uncomfortable in office.  Was slightly tachycardic and increased blood pressures.      Relevant Medications   HYDROcodone-acetaminophen (NORCO) 10-325 MG tablet   morphine (MS CONTIN) 30 MG 12 hr tablet   Other Relevant Orders   DG Lumbar Spine Complete (Completed)   POCT urinalysis dipstick (Completed)   POCT urine pregnancy (Completed)   Urine Culture   Abnormal urinalysis    Given patient's symptoms checked urinalysis in office.  Did have leukocytes 3+.  After joint discussion with patient and discussing findings patient declines to start antibiotic use currently.  She states she would like to wait for urine culture to result as she does not feel like she has a urinary tract infection.  Did discuss with patient that the urinary culture could take several days to come back she is in agreements with this continue to monitor symptoms pending urinary culture        No orders of the defined types were placed in this encounter.  This visit occurred during the SARS-CoV-2 public health emergency.  Safety protocols were in place, including screening questions prior to the visit, additional usage of staff PPE, and extensive cleaning of exam room while observing appropriate contact time as indicated for disinfecting solutions.    Romilda Garret, NP

## 2021-09-04 NOTE — Assessment & Plan Note (Signed)
This is ongoing chronic pain per patient for left upper extremity.  She was followed by Dr. Mechele Dawley in [place] currently transferred to a pain clinic in [place] with [facility]

## 2021-09-05 ENCOUNTER — Encounter: Payer: Self-pay | Admitting: Nurse Practitioner

## 2021-09-07 LAB — URINE CULTURE
MICRO NUMBER:: 13018544
SPECIMEN QUALITY:: ADEQUATE

## 2022-07-07 IMAGING — CR DG LUMBAR SPINE COMPLETE 4+V
1 series · 5 of 5 positions shown · non-contrast
Comparison: None.

CLINICAL DATA: Spinal cord stimulator, concern for shift

EXAM:
LUMBAR SPINE - COMPLETE 4+ VIEW

[Series 1: dg lumbar spine complete 4 +v · 0.14mm/px · 5 of 5 slices shown]
[im 1/5]
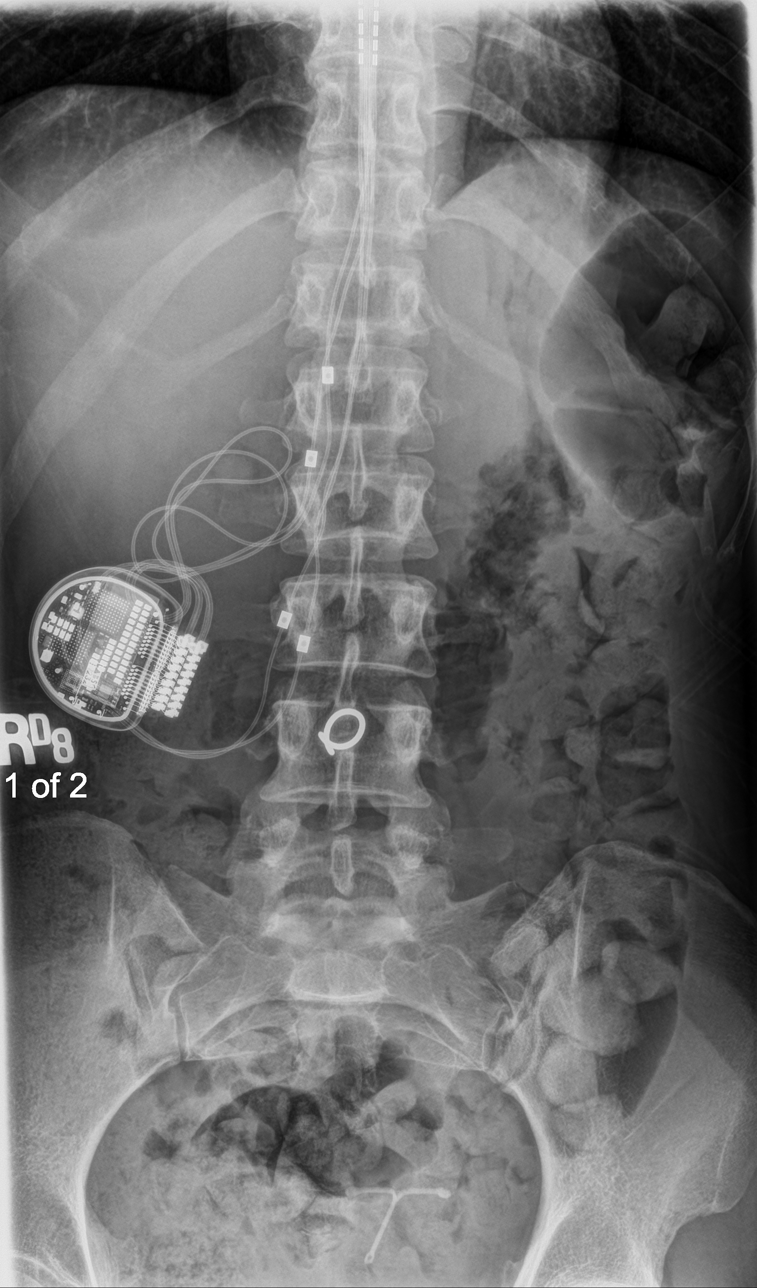
[im 2/5]
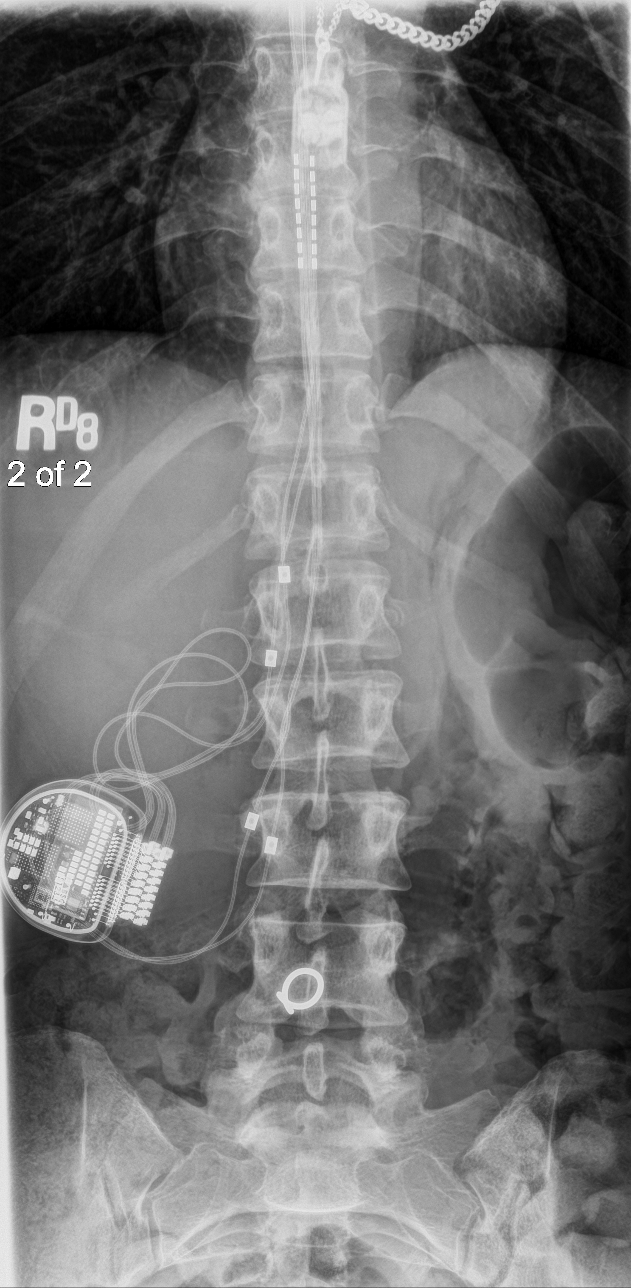
[im 3/5]
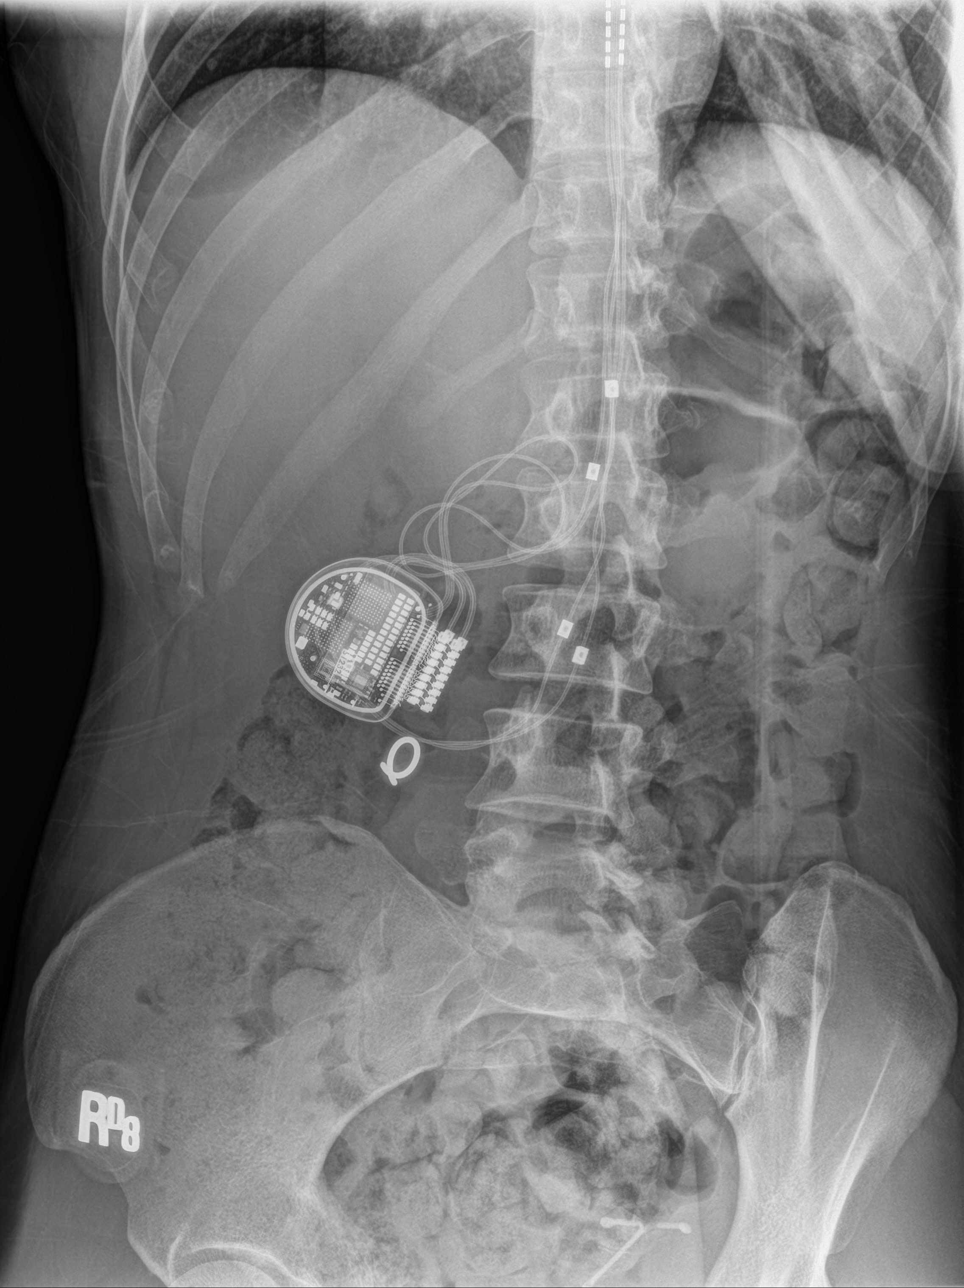
[im 4/5]
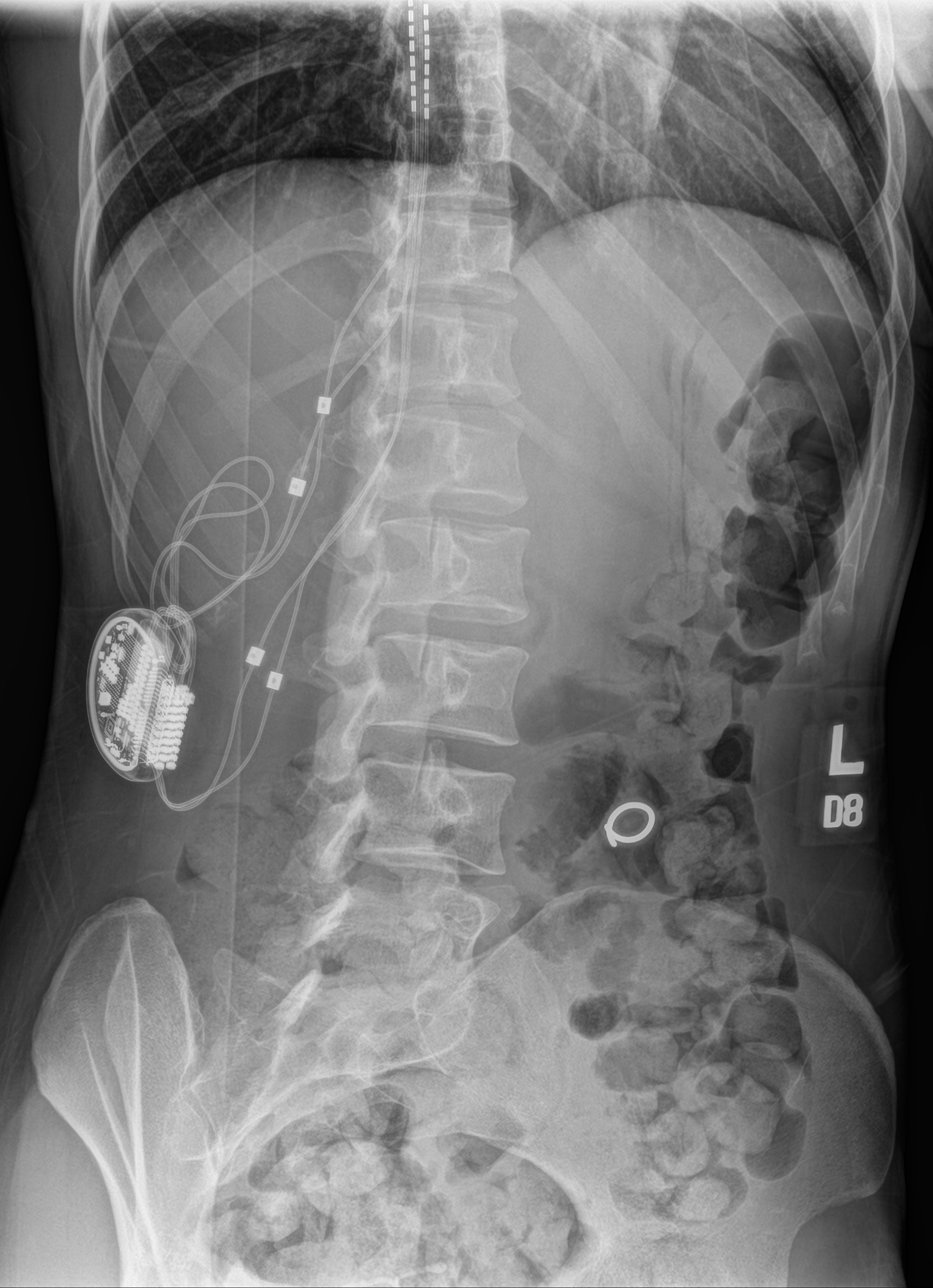
[im 5/5]
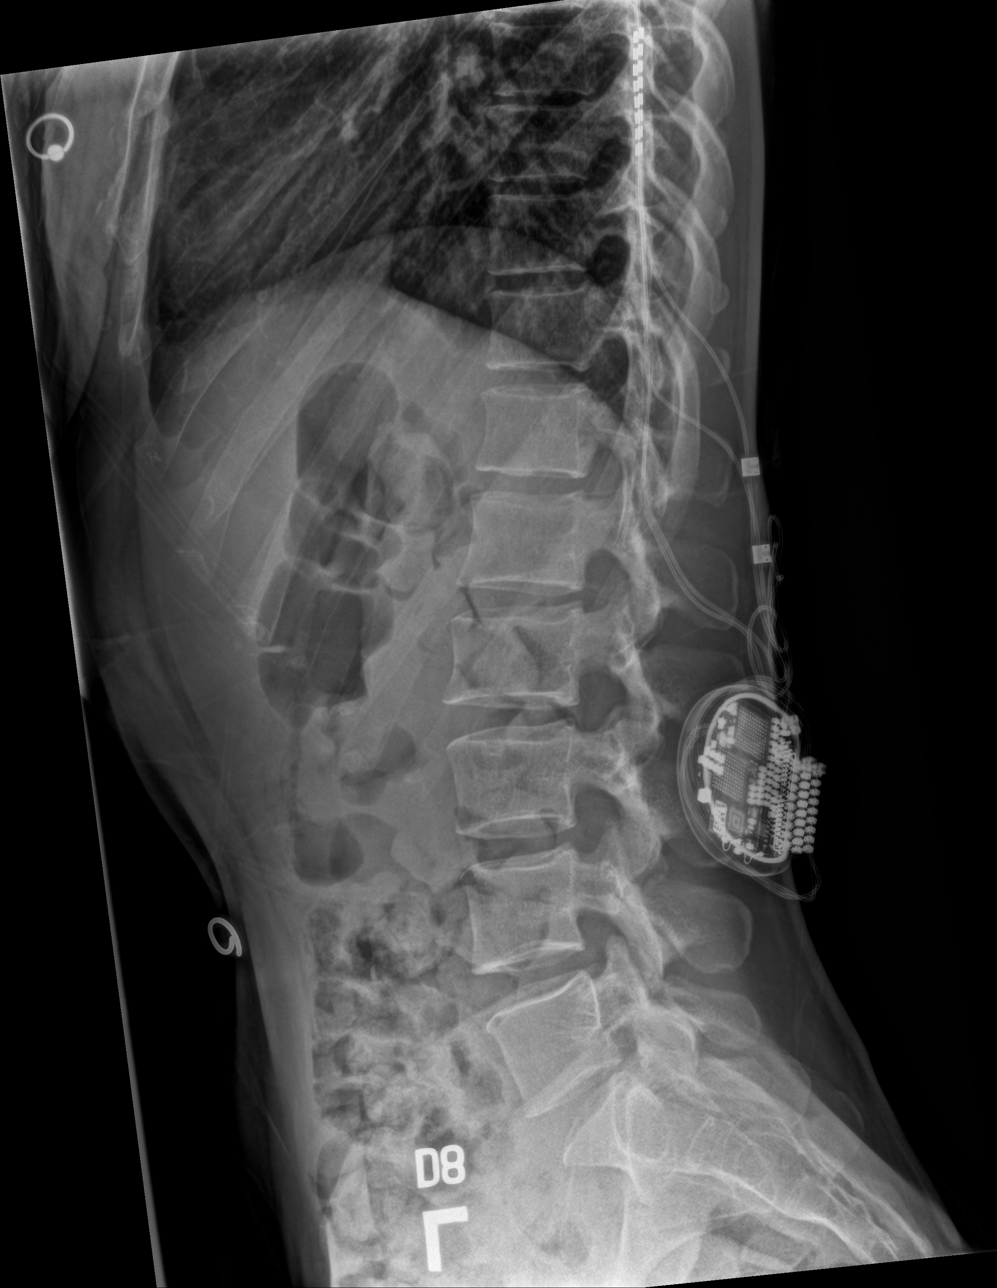

[5 of 5 positions shown; findings below may reference images not displayed]

FINDINGS: There is no evidence of lumbar spine fracture. Alignment is normal.
Intervertebral disc spaces are maintained. Right-sided spinal
stimulator device is present with multiple leads that appear to be
positioned within the lower thoracic dorsal epidural space. There is
no prior imaging for the purpose of comparison. Nonobstructive
pattern of included bowel gas.
IMPRESSION: No acute abnormality of the lumbar spine. Right-sided spinal
stimulator device is present with multiple leads that appear to be
positioned within the lower thoracic dorsal epidural space. No prior
imaging is available for the purpose of comparison.
